# Patient Record
Sex: Female | Born: 1945 | Race: White | Hispanic: No | Smoking: Current every day smoker
Health system: Southern US, Community
[De-identification: ages and names within clinical notes are randomized; demographics above are authoritative.]

## PROBLEM LIST (undated history)

## (undated) DIAGNOSIS — M1990 Unspecified osteoarthritis, unspecified site: Secondary | ICD-10-CM

## (undated) DIAGNOSIS — E7800 Pure hypercholesterolemia, unspecified: Secondary | ICD-10-CM

## (undated) DIAGNOSIS — F419 Anxiety disorder, unspecified: Secondary | ICD-10-CM

## (undated) DIAGNOSIS — F17200 Nicotine dependence, unspecified, uncomplicated: Secondary | ICD-10-CM

## (undated) DIAGNOSIS — M810 Age-related osteoporosis without current pathological fracture: Secondary | ICD-10-CM

## (undated) DIAGNOSIS — G4700 Insomnia, unspecified: Secondary | ICD-10-CM

## (undated) DIAGNOSIS — J45909 Unspecified asthma, uncomplicated: Secondary | ICD-10-CM

## (undated) DIAGNOSIS — N952 Postmenopausal atrophic vaginitis: Secondary | ICD-10-CM

## (undated) DIAGNOSIS — K219 Gastro-esophageal reflux disease without esophagitis: Secondary | ICD-10-CM

## (undated) DIAGNOSIS — J449 Chronic obstructive pulmonary disease, unspecified: Secondary | ICD-10-CM

## (undated) HISTORY — PX: COLONOSCOPY: SHX174

## (undated) HISTORY — DX: Nicotine dependence, unspecified, uncomplicated: F17.200

## (undated) HISTORY — PX: BREAST BIOPSY: SHX20

## (undated) HISTORY — DX: Anxiety disorder, unspecified: F41.9

## (undated) HISTORY — DX: Chronic obstructive pulmonary disease, unspecified: J44.9

## (undated) HISTORY — PX: OTHER SURGICAL HISTORY: SHX169

## (undated) HISTORY — DX: Insomnia, unspecified: G47.00

## (undated) HISTORY — DX: Unspecified asthma, uncomplicated: J45.909

## (undated) HISTORY — PX: ESOPHAGOGASTRODUODENOSCOPY: SHX1529

## (undated) HISTORY — DX: Gastro-esophageal reflux disease without esophagitis: K21.9

## (undated) HISTORY — DX: Postmenopausal atrophic vaginitis: N95.2

## (undated) HISTORY — DX: Age-related osteoporosis without current pathological fracture: M81.0

## (undated) HISTORY — PX: VAGINAL HYSTERECTOMY: SUR661

## (undated) HISTORY — DX: Pure hypercholesterolemia, unspecified: E78.00

## (undated) HISTORY — DX: Unspecified osteoarthritis, unspecified site: M19.90

---

## 2005-12-29 HISTORY — PX: APPENDECTOMY: SHX54

## 2019-10-26 ENCOUNTER — Ambulatory Visit (INDEPENDENT_AMBULATORY_CARE_PROVIDER_SITE_OTHER): Payer: Medicare Other | Admitting: Urology

## 2019-10-26 DIAGNOSIS — N3021 Other chronic cystitis with hematuria: Secondary | ICD-10-CM | POA: Diagnosis not present

## 2019-12-20 ENCOUNTER — Encounter: Payer: Self-pay | Admitting: Urology

## 2020-03-05 ENCOUNTER — Telehealth: Payer: Self-pay | Admitting: Urology

## 2020-03-05 NOTE — Telephone Encounter (Signed)
I left her a message for her. She can do a nurse visit this Wednesday for a urine drop off. Not sure which number she will call back

## 2020-03-05 NOTE — Telephone Encounter (Signed)
Patient called ans states she is having issue with UTI. She has appt on 03/28/20 with Dr Ronne Binning but feels she may need something before whether it be virtual visit or what have you.

## 2020-03-06 NOTE — Telephone Encounter (Signed)
Patient called and she is going to her PCPs office to do urine culture.

## 2020-03-07 ENCOUNTER — Other Ambulatory Visit: Payer: Self-pay

## 2020-03-09 ENCOUNTER — Other Ambulatory Visit: Payer: Self-pay

## 2020-03-13 ENCOUNTER — Other Ambulatory Visit: Payer: Self-pay

## 2020-03-13 ENCOUNTER — Telehealth: Payer: Self-pay

## 2020-03-13 DIAGNOSIS — R300 Dysuria: Secondary | ICD-10-CM

## 2020-03-13 NOTE — Telephone Encounter (Signed)
-----   Message from Malen Gauze, MD sent at 03/13/2020  7:51 AM EDT ----- Please send bactrim DS BID for 7 days ----- Message ----- From: Ferdinand Lango, RN Sent: 03/09/2020   8:49 AM EDT To: Malen Gauze, MD  Please review.  ----- Message ----- From: Peter Congo Sent: 03/09/2020   8:38 AM EST To: [facility] [place] [facility]  Please review.

## 2020-03-13 NOTE — Telephone Encounter (Signed)
Called pt. Pt was actually seen by PCP last week and placed on cipro for urine results. Pt is on last day of cipro and appt scheduled for tomorrow to see MD per pt request.

## 2020-03-13 NOTE — Telephone Encounter (Signed)
Called pt. No answer. No way to leave message. No pharmacy listed in Epic to send in Rx

## 2020-03-13 NOTE — Telephone Encounter (Signed)
-----   Message from Patrick L McKenzie, MD sent at 03/13/2020  7:51 AM EDT ----- Please send bactrim DS BID for 7 days ----- Message ----- From: Ramari Bray K, RN Sent: 03/09/2020   8:49 AM EDT To: Patrick L McKenzie, MD  Please review.  ----- Message ----- From: Pruitt, Tracy M Sent: 03/09/2020   8:38 AM EST To: [facility]  Please review.   

## 2020-03-14 ENCOUNTER — Other Ambulatory Visit: Payer: Self-pay

## 2020-03-14 ENCOUNTER — Encounter: Payer: Self-pay | Admitting: Urology

## 2020-03-14 ENCOUNTER — Ambulatory Visit (INDEPENDENT_AMBULATORY_CARE_PROVIDER_SITE_OTHER): Payer: Medicare Other | Admitting: Urology

## 2020-03-14 VITALS — BP 119/59 | HR 80 | Temp 97.8°F | Ht 63.5 in | Wt 135.0 lb

## 2020-03-14 DIAGNOSIS — R300 Dysuria: Secondary | ICD-10-CM | POA: Diagnosis not present

## 2020-03-14 DIAGNOSIS — N3021 Other chronic cystitis with hematuria: Secondary | ICD-10-CM | POA: Insufficient documentation

## 2020-03-14 LAB — POCT URINALYSIS DIPSTICK
Bilirubin, UA: NEGATIVE
Glucose, UA: NEGATIVE
Ketones, UA: NEGATIVE
Nitrite, UA: NEGATIVE
Protein, UA: NEGATIVE
Spec Grav, UA: 1.015 (ref 1.010–1.025)
Urobilinogen, UA: NEGATIVE E.U./dL — AB
pH, UA: 6.5 (ref 5.0–8.0)

## 2020-03-14 MED ORDER — NITROFURANTOIN MACROCRYSTAL 100 MG PO CAPS: 100.0000 mg | ORAL_CAPSULE | Freq: Two times a day (BID) | ORAL | 5 refills | Status: DC

## 2020-03-14 NOTE — Progress Notes (Signed)
pocUrological Symptom Review  Patient is experiencing the following symptoms: None  Review of Systems  Gastrointestinal (upper)  : Negative for upper GI symptoms  Gastrointestinal (lower) : Negative for lower GI symptoms  Constitutional : Negative for symptoms  Skin: Negative for skin symptoms  Eyes: Negative for eye symptoms  Ear/Nose/Throat : Negative for Ear/Nose/Throat symptoms  Hematologic/Lymphatic: Negative for Hematologic/Lymphatic symptoms  Cardiovascular : Negative for cardiovascular symptoms  Respiratory : Negative for respiratory symptoms  Endocrine: Negative for endocrine symptoms  Musculoskeletal: Negative for musculoskeletal symptoms  Neurological: Negative for neurological symptoms  Psychologic: Negative for psychiatric symptoms

## 2020-03-14 NOTE — Patient Instructions (Signed)
Urinary Tract Infection, Adult A urinary tract infection (UTI) is an infection of any part of the urinary tract. The urinary tract includes:  The kidneys.  The ureters.  The bladder.  The urethra. These organs make, store, and get rid of pee (urine) in the body. What are the causes? This is caused by germs (bacteria) in your genital area. These germs grow and cause swelling (inflammation) of your urinary tract. What increases the risk? You are more likely to develop this condition if:  You have a small, thin tube (catheter) to drain pee.  You cannot control when you pee or poop (incontinence).  You are female, and: ? You use these methods to prevent pregnancy:  A medicine that kills sperm (spermicide).  A device that blocks sperm (diaphragm). ? You have low levels of a female hormone (estrogen). ? You are pregnant.  You have genes that add to your risk.  You are sexually active.  You take antibiotic medicines.  You have trouble peeing because of: ? A prostate that is bigger than normal, if you are female. ? A blockage in the part of your body that drains pee from the bladder (urethra). ? A kidney stone. ? A nerve condition that affects your bladder (neurogenic bladder). ? Not getting enough to drink. ? Not peeing often enough.  You have other conditions, such as: ? Diabetes. ? A weak disease-fighting system (immune system). ? Sickle cell disease. ? Gout. ? Injury of the spine. What are the signs or symptoms? Symptoms of this condition include:  Needing to pee right away (urgently).  Peeing often.  Peeing small amounts often.  Pain or burning when peeing.  Blood in the pee.  Pee that smells bad or not like normal.  Trouble peeing.  Pee that is cloudy.  Fluid coming from the vagina, if you are female.  Pain in the belly or lower back. Other symptoms include:  Throwing up (vomiting).  No urge to eat.  Feeling mixed up (confused).  Being tired  and grouchy (irritable).  A fever.  Watery poop (diarrhea). How is this treated? This condition may be treated with:  Antibiotic medicine.  Other medicines.  Drinking enough water. Follow these instructions at home:  Medicines  Take over-the-counter and prescription medicines only as told by your doctor.  If you were prescribed an antibiotic medicine, take it as told by your doctor. Do not stop taking it even if you start to feel better. General instructions  Make sure you: ? Pee until your bladder is empty. ? Do not hold pee for a long time. ? Empty your bladder after sex. ? Wipe from front to back after pooping if you are a female. Use each tissue one time when you wipe.  Drink enough fluid to keep your pee pale yellow.  Keep all follow-up visits as told by your doctor. This is important. Contact a doctor if:  You do not get better after 1-2 days.  Your symptoms go away and then come back. Get help right away if:  You have very bad back pain.  You have very bad pain in your lower belly.  You have a fever.  You are sick to your stomach (nauseous).  You are throwing up. Summary  A urinary tract infection (UTI) is an infection of any part of the urinary tract.  This condition is caused by germs in your genital area.  There are many risk factors for a UTI. These include having a small, thin   tube to drain pee and not being able to control when you pee or poop.  Treatment includes antibiotic medicines for germs.  Drink enough fluid to keep your pee pale yellow. This information is not intended to replace advice given to you by your health care provider. Make sure you discuss any questions you have with your health care provider. Document Revised: 12/02/2018 Document Reviewed: 06/24/2018 Elsevier Patient Education  2020 Elsevier Inc.  

## 2020-03-14 NOTE — Progress Notes (Signed)
   03/14/2020 2:20 PM   Stacie Ferrell Dec 18, 1946 [id number]  Referring provider: No referring provider defined for this encounter.  dysuria  HPI: Stacie Ferrell is a 74yo here for followup for recurrent UTIs. She is currently on bactrim post coital prophylaxis and  Was doing well until the last week when she developed a UTI, Proteus. No sensitivities available. She finished rx for cipro. Her dysuria resolved. No worsening LUTS. She has issues with constipation and is taking miralax 17g daily  She has stopped topical estrogen therapy   PMH: No past medical history on file.  Surgical History:   Home Medications:  Allergies as of 03/14/2020      Reactions   Daypro [oxaprozin] Rash      Medication List    as of March 14, 2020  2:20 PM   You have not been prescribed any medications.     Allergies:  Allergies  Allergen Reactions  . Daypro [Oxaprozin] Rash    Family History: No family history on file.  Social History:  reports that she has been smoking cigarettes. She does not have any smokeless tobacco history on file. No history on file for alcohol and drug.  ROS: All other review of systems were reviewed and are negative except what is noted above in HPI  Physical Exam: BP (!) 119/59   Pulse 80   Temp 97.8 F (36.6 C)   Ht 5' 3.5" (1.613 m)   Wt 135 lb (61.2 kg)   BMI 23.54 kg/m   Constitutional:  Alert and oriented, No acute distress. HEENT: [place] AT, moist mucus membranes.  Trachea midline, no masses. Cardiovascular: No clubbing, cyanosis, or edema. Respiratory: Normal respiratory effort, no increased work of breathing. GI: Abdomen is soft, nontender, nondistended, no abdominal masses GU: No CVA tenderness Lymph: No cervical or inguinal lymphadenopathy. Skin: No rashes, bruises or suspicious lesions. Neurologic: Grossly intact, no focal deficits, moving all 4 extremities. Psychiatric: Normal mood and affect.  Laboratory Data: No results found for: WBC, HGB, HCT,  MCV, PLT  No results found for: CREATININE  No results found for: PSA  No results found for: TESTOSTERONE  No results found for: HGBA1C  Urinalysis    Component Value Date/Time   BILIRUBINUR neg 03/14/2020 1417   PROTEINUR Negative 03/14/2020 1417   UROBILINOGEN negative (A) 03/14/2020 1417   NITRITE neg 03/14/2020 1417   LEUKOCYTESUR Trace (A) 03/14/2020 1417    No results found for: LABMICR, WBCUA, RBCUA, LABEPIT, MUCUS, BACTERIA  Pertinent Imaging:  No results found for this or any previous visit. No results found for this or any previous visit. No results found for this or any previous visit. No results found for this or any previous visit. No results found for this or any previous visit. No results found for this or any previous visit. No results found for this or any previous visit. No results found for this or any previous visit.  Assessment & Plan:    1. Dysuria -likely related to UTI. resolved - POCT urinalysis dipstick  2. Chronic cystitis with hematuria -We discussed the natural hx of recurrent UTIs and the various causes. We discussed the treatment options including post coital prophylaxis, daily prophylaxis, topical estrogen therapy. We have agreed to pursue self start macrodantin. RTC 6 months    No follow-ups on file.  Wilkie Aye, MD  [facility] [place]

## 2020-03-28 ENCOUNTER — Ambulatory Visit: Payer: Medicare Other | Admitting: Urology

## 2020-04-02 ENCOUNTER — Telehealth: Payer: Self-pay | Admitting: Urology

## 2020-04-02 ENCOUNTER — Other Ambulatory Visit (HOSPITAL_COMMUNITY)
Admission: AD | Admit: 2020-04-02 | Discharge: 2020-04-02 | Disposition: A | Discharge status: Home / Self Care | Payer: Medicare Other | Source: Other Acute Inpatient Hospital | Attending: Urology | Admitting: Urology

## 2020-04-02 ENCOUNTER — Ambulatory Visit (INDEPENDENT_AMBULATORY_CARE_PROVIDER_SITE_OTHER): Payer: Medicare Other | Admitting: Urology

## 2020-04-02 ENCOUNTER — Other Ambulatory Visit: Payer: Self-pay

## 2020-04-02 DIAGNOSIS — N3021 Other chronic cystitis with hematuria: Secondary | ICD-10-CM

## 2020-04-02 DIAGNOSIS — R300 Dysuria: Secondary | ICD-10-CM

## 2020-04-02 MED ORDER — NITROFURANTOIN MACROCRYSTAL 100 MG PO CAPS: 100.0000 mg | ORAL_CAPSULE | Freq: Two times a day (BID) | ORAL | 6 refills | Status: DC

## 2020-04-02 NOTE — Telephone Encounter (Signed)
Patient coming in to bring urine specimen today per nurse.

## 2020-04-02 NOTE — Telephone Encounter (Signed)
Pt states UTI meds not working, she requests a nurse return her call.

## 2020-04-02 NOTE — Progress Notes (Signed)
Pt came and left a sample at office with UTI symptoms. Urine sent for culture.

## 2020-04-03 ENCOUNTER — Telehealth: Payer: Self-pay | Admitting: Urology

## 2020-04-03 NOTE — Telephone Encounter (Signed)
Pt  Called and states that the same medication was sent in that she told us does not work for her infections.

## 2020-04-03 NOTE — Telephone Encounter (Signed)
Pt called back again.  Requests a call back

## 2020-04-03 NOTE — Telephone Encounter (Signed)
Pt. Called back and asked if urine would be cultured and then see about what med to send. I explained that was the plan. Pt. Asked to be called back on cell phone number.

## 2020-04-03 NOTE — Telephone Encounter (Signed)
Attempted to call pt. Back again. Still no answer.

## 2020-04-03 NOTE — Telephone Encounter (Signed)
Attempted to call pt. No answer. No VM . 

## 2020-04-04 ENCOUNTER — Ambulatory Visit (INDEPENDENT_AMBULATORY_CARE_PROVIDER_SITE_OTHER): Payer: Medicare Other | Admitting: Internal Medicine

## 2020-04-04 ENCOUNTER — Encounter: Payer: Self-pay | Admitting: Internal Medicine

## 2020-04-04 VITALS — BP 120/70 | HR 95 | Temp 97.9°F | Ht 63.5 in | Wt 137.0 lb

## 2020-04-04 DIAGNOSIS — K5900 Constipation, unspecified: Secondary | ICD-10-CM | POA: Diagnosis not present

## 2020-04-04 DIAGNOSIS — K5730 Diverticulosis of large intestine without perforation or abscess without bleeding: Secondary | ICD-10-CM

## 2020-04-04 DIAGNOSIS — Z86010 Personal history of colonic polyps: Secondary | ICD-10-CM | POA: Diagnosis not present

## 2020-04-04 LAB — URINE CULTURE: Culture: 30000 — AB

## 2020-04-04 NOTE — Progress Notes (Signed)
Stacie Ferrell 74 y.o. [birth year] [id number]  Assessment & Plan:   Encounter Diagnoses  Name Primary?   Constipation, transient Yes   Hx of adenomatous colonic polyps    Diverticulosis of colon without hemorrhage     I think it is fine for her to stop MiraLAX and see how she does.  Prior to her transient several day history of constipation she was doing well and not needing laxatives.  She may need to titrate MiraLAX back but since right now sometimes she has difficulty determining gas versus stool and has had some incontinence it makes perfect sense to stop and see.  She prefers not to use fiber.  Says she eats a high-fiber diet.  I do not know that she needs a routine repeat colonoscopy based upon age and previous findings.  We reviewed diverticulosis and the narrowing of the colon that can be associated with that and the fact that her prior hysterectomy probably has led to a fixed sigmoid colon.  I will be available to see her as needed.  CC: Stacie Rabon, MD   Subjective:   Chief Complaint: Constipation  HPI Stacie Ferrell is a 74 year old woman known to one of our nurses in the endoscopy area who is here because of recent problems with constipation.  She has been followed in [facility] by Stacie Fer[facility], last colonoscopy 2020 with 2 small subcentimeter adenomas in the cecum and adherent tortuous rectosigmoid colon with some diverticulosis.  She had about a 4-day period of fairly severe constipation about 8 weeks ago she tried over-the-counter medications and eventually got some relief she saw Stacie Ferrell he recommended regular MiraLAX to twice daily which she has titrated down to 1 a day.  She still has issues where she cannot always tell if it is going to be flatus or stool and has some fecal leakage and wonders if she can stop the MiraLAX.  Bowel movements are sometimes a little small since starting the MiraLAX.  Prior to that absolutely no trouble with constipation.  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  She feels well now other than those issues mentioned above. Allergies  Allergen Reactions   Codeine Sulfate [Codeine] Nausea Only    On an empty stomach   Fosamax [Alendronate] Other (See Comments)    GERD worse and bone ache   Daypro [Oxaprozin] Rash   Voltaren [Diclofenac] Rash   Current Meds  Medication Sig   calcium carbonate (OSCAL) 1500 (600 Ca) MG TABS tablet Take by mouth 2 (two) times daily with a meal.   cholecalciferol (VITAMIN D3) 25 MCG (1000 UNIT) tablet Take 1,000 Units by mouth daily.   denosumab (PROLIA) 60 MG/ML SOSY injection Inject 60 mg into the skin every 6 (six) months.   diphenhydrAMINE (BENADRYL) 25 MG tablet Take 25 mg by mouth every 6 (six) hours as needed.   estradiol (ESTRACE) 0.1 MG/GM vaginal cream Place 1 Applicatorful vaginally 2 (two) times a week.   Multiple Vitamins-Minerals (ICAPS AREDS 2 PO) Take by mouth.   Multiple Vitamins-Minerals (SENIOR MULTIVITAMIN PLUS PO) Take by mouth.   nitrofurantoin (MACRODANTIN) 100 MG capsule Take 1 capsule (100 mg total) by mouth 2 (two) times daily.   traMADol (ULTRAM) 50 MG tablet Take  by mouth every 6 (six) hours as needed.   [DISCONTINUED] polyethylene glycol (MIRALAX / GLYCOLAX) 17 g packet Take 17 g by mouth daily.   Past Medical History:  Diagnosis Date   Anxiety    Asthma    Chronic obstructive pulmonary disease (COPD) (HCC)    GERD (gastroesophageal reflux disease)    Hypercholesterolemia    Insomnia    Osteoarthrosis    Osteoporosis    Postmenopausal atrophic vaginitis    Tobacco use disorder    Past Surgical History:  Procedure Laterality Date   APPENDECTOMY  2007   BREAST BIOPSY     cecum volvulus abdominal  surgery     COLONOSCOPY     ESOPHAGOGASTRODUODENOSCOPY     ingrown toenails     VAGINAL HYSTERECTOMY     Social History   Social History Narrative   Widowed and retired 2 children   She is a smoker but no alcohol or drug use   family history includes COPD in her mother; Cancer in her mother; Colon cancer in her father; Congestive Heart Failure in her mother; Parkinson's disease in her father.   Review of Systems Chronic recurrent UTI issues Rare stress urinary incontinence  Objective:   Physical Exam BP 120/70    Pulse 95    Temp 97.9 F (36.6 C)    Ht 5' 3.5" (1.613 m)    Wt 137 lb (62.1 kg)    BMI 23.89 kg/m  No acute distress Well-developed well-nourished white woman, elderly but appears age-appropriate or younger Abdomen is soft there are surgical scars I cannot detect any hernia bowel sounds are present nontender no masses   Data reviewed include 2020 colonoscopy 2020 EGD 2015 colonoscopy pathology report GI office notes from similar age/date range  Had a single adenoma in 2015 on colonoscopy and an inflammatory polyp, gastritis on EGD at that time  Labs at primary care August 2020 normal CBC normal CMET

## 2020-04-04 NOTE — Patient Instructions (Signed)
As we discussed, stop the MiraLax and see how you do.  You can always add it back in to your regimen if needed.  Let me know if you have questions down the road.  I appreciate the opportunity to care for you. Iva Boop, MD, [facility]

## 2020-04-04 NOTE — Telephone Encounter (Signed)
Pt called and said she can reached on her cell phone.

## 2020-04-05 ENCOUNTER — Telehealth: Payer: Self-pay | Admitting: Urology

## 2020-04-05 NOTE — Telephone Encounter (Signed)
Pt notified of antibiotic sent to pharmacy

## 2020-04-05 NOTE — Telephone Encounter (Signed)
Patient called back this morning and is upset that she didn't get a return call regarding her urine culture results and also she states she has UTI symptoms and she needs meds asap. I verbally spoke with Marchelle Folks and she is waiting on response from Dr. Ronne Binning. I communicated that to the patient and she understood.

## 2020-04-05 NOTE — Telephone Encounter (Signed)
Pt states the macrodantin post intercourse does not work for pt. She is asking for something else.

## 2020-04-05 NOTE — Telephone Encounter (Signed)
Verbal order received for ceftin 500mg  po BID for 7 days ,called prescription into [facility] in [place] and gave order to [facility] pharmacist.

## 2020-04-17 ENCOUNTER — Telehealth: Payer: Self-pay

## 2020-04-17 NOTE — Telephone Encounter (Signed)
Pt called asking for a post coital rx. She is leaving for the beach next week and is concerned she will develop an UTI.  She states macrodantin does not help. She is asking for a cipro prescription to have if symptoms warrant.

## 2020-04-20 ENCOUNTER — Telehealth: Payer: Self-pay | Admitting: Urology

## 2020-04-20 DIAGNOSIS — R300 Dysuria: Secondary | ICD-10-CM

## 2020-04-20 MED ORDER — SULFAMETHOXAZOLE-TRIMETHOPRIM 800-160 MG PO TABS: 1.0000 | ORAL_TABLET | ORAL | 0 refills | Status: DC | PRN

## 2020-04-20 NOTE — Telephone Encounter (Signed)
The patient called and she stated she has not received a phone call back regarding her RX request. She states she is going out of town on Monday and would need to have it before then.

## 2020-04-20 NOTE — Telephone Encounter (Signed)
I spoke with Dr. Ronne Binning who gave verbal for Bactrim DS 1 tablet as needed post intercourse. rx sent in. Pt notified.

## 2020-05-31 ENCOUNTER — Other Ambulatory Visit: Payer: Self-pay

## 2020-05-31 ENCOUNTER — Telehealth: Payer: Self-pay | Admitting: Urology

## 2020-05-31 DIAGNOSIS — N3021 Other chronic cystitis with hematuria: Secondary | ICD-10-CM

## 2020-05-31 MED ORDER — CIPROFLOXACIN HCL 250 MG PO TABS: 250.0000 mg | ORAL_TABLET | Freq: Once | ORAL | 1 refills | Status: AC

## 2020-05-31 NOTE — Telephone Encounter (Signed)
Prescription sent in to [facility]. Pt called.  Left message for pt to pick up rx

## 2020-05-31 NOTE — Telephone Encounter (Signed)
Pt requests nurse return her call.

## 2020-05-31 NOTE — Telephone Encounter (Signed)
Pt reports Bactrim no longer works for her post intercourse. Pt states Cipro is what urgent care gave her over this past weekend and helped her infections. Pt is asking if you could change her post intercourse abx to cipro as needed.

## 2020-05-31 NOTE — Telephone Encounter (Signed)
Yes change her to cipro 250mg 

## 2020-07-12 ENCOUNTER — Other Ambulatory Visit: Payer: Self-pay

## 2020-07-12 ENCOUNTER — Encounter: Payer: Self-pay | Admitting: Urology

## 2020-07-12 ENCOUNTER — Ambulatory Visit (INDEPENDENT_AMBULATORY_CARE_PROVIDER_SITE_OTHER): Payer: Medicare Other | Admitting: Urology

## 2020-07-12 VITALS — BP 114/68 | HR 68 | Temp 97.8°F | Ht 63.5 in | Wt 135.4 lb

## 2020-07-12 DIAGNOSIS — R300 Dysuria: Secondary | ICD-10-CM

## 2020-07-12 DIAGNOSIS — N3021 Other chronic cystitis with hematuria: Secondary | ICD-10-CM | POA: Diagnosis not present

## 2020-07-12 LAB — POCT URINALYSIS DIPSTICK
Bilirubin, UA: NEGATIVE
Blood, UA: NEGATIVE
Glucose, UA: NEGATIVE
Ketones, UA: NEGATIVE
Leukocytes, UA: NEGATIVE
Nitrite, UA: NEGATIVE
Protein, UA: NEGATIVE
Spec Grav, UA: <=1.005 — AB (ref 1.010–1.025)
Urobilinogen, UA: 0.2 E.U./dL
pH, UA: 7 (ref 5.0–8.0)

## 2020-07-12 MED ORDER — CIPROFLOXACIN HCL 250 MG PO TABS
250.0000 mg | ORAL_TABLET | Freq: Two times a day (BID) | ORAL | 5 refills | Status: DC
Start: 2020-07-12 — End: 2021-03-01

## 2020-07-12 NOTE — Patient Instructions (Signed)
Urinary Tract Infection, Adult A urinary tract infection (UTI) is an infection of any part of the urinary tract. The urinary tract includes:  The kidneys.  The ureters.  The bladder.  The urethra. These organs make, store, and get rid of pee (urine) in the body. What are the causes? This is caused by germs (bacteria) in your genital area. These germs grow and cause swelling (inflammation) of your urinary tract. What increases the risk? You are more likely to develop this condition if:  You have a small, thin tube (catheter) to drain pee.  You cannot control when you pee or poop (incontinence).  You are female, and: ? You use these methods to prevent pregnancy:  A medicine that kills sperm (spermicide).  A device that blocks sperm (diaphragm). ? You have low levels of a female hormone (estrogen). ? You are pregnant.  You have genes that add to your risk.  You are sexually active.  You take antibiotic medicines.  You have trouble peeing because of: ? A prostate that is bigger than normal, if you are female. ? A blockage in the part of your body that drains pee from the bladder (urethra). ? A kidney stone. ? A nerve condition that affects your bladder (neurogenic bladder). ? Not getting enough to drink. ? Not peeing often enough.  You have other conditions, such as: ? Diabetes. ? A weak disease-fighting system (immune system). ? Sickle cell disease. ? Gout. ? Injury of the spine. What are the signs or symptoms? Symptoms of this condition include:  Needing to pee right away (urgently).  Peeing often.  Peeing small amounts often.  Pain or burning when peeing.  Blood in the pee.  Pee that smells bad or not like normal.  Trouble peeing.  Pee that is cloudy.  Fluid coming from the vagina, if you are female.  Pain in the belly or lower back. Other symptoms include:  Throwing up (vomiting).  No urge to eat.  Feeling mixed up (confused).  Being tired  and grouchy (irritable).  A fever.  Watery poop (diarrhea). How is this treated? This condition may be treated with:  Antibiotic medicine.  Other medicines.  Drinking enough water. Follow these instructions at home:  Medicines  Take over-the-counter and prescription medicines only as told by your doctor.  If you were prescribed an antibiotic medicine, take it as told by your doctor. Do not stop taking it even if you start to feel better. General instructions  Make sure you: ? Pee until your bladder is empty. ? Do not hold pee for a long time. ? Empty your bladder after sex. ? Wipe from front to back after pooping if you are a female. Use each tissue one time when you wipe.  Drink enough fluid to keep your pee pale yellow.  Keep all follow-up visits as told by your doctor. This is important. Contact a doctor if:  You do not get better after 1-2 days.  Your symptoms go away and then come back. Get help right away if:  You have very bad back pain.  You have very bad pain in your lower belly.  You have a fever.  You are sick to your stomach (nauseous).  You are throwing up. Summary  A urinary tract infection (UTI) is an infection of any part of the urinary tract.  This condition is caused by germs in your genital area.  There are many risk factors for a UTI. These include having a small, thin   tube to drain pee and not being able to control when you pee or poop.  Treatment includes antibiotic medicines for germs.  Drink enough fluid to keep your pee pale yellow. This information is not intended to replace advice given to you by your health care provider. Make sure you discuss any questions you have with your health care provider. Document Revised: 12/02/2018 Document Reviewed: 06/24/2018 Elsevier Patient Education  2020 Elsevier Inc.  

## 2020-07-12 NOTE — Progress Notes (Signed)
Urological Symptom Review  Patient is experiencing the following symptoms: none   Review of Systems  Gastrointestinal (upper)  : Negative for upper GI symptoms  Gastrointestinal (lower) : Constipation  Constitutional : Negative for symptoms  Skin: Negative for skin symptoms  Eyes: Negative for eye symptoms  Ear/Nose/Throat : Negative for Ear/Nose/Throat symptoms  Hematologic/Lymphatic: Negative for Hematologic/Lymphatic symptoms  Cardiovascular : Negative for cardiovascular symptoms  Respiratory : Negative for respiratory symptoms  Endocrine: Negative for endocrine symptoms  Musculoskeletal: Negative for musculoskeletal symptoms  Neurological: Negative for neurological symptoms  Psychologic: Negative for psychiatric symptoms  

## 2020-07-12 NOTE — Progress Notes (Signed)
07/12/2020 11:09 AM   Stacie Ferrell [birth year] [id number]  Referring provider: Romeo Rabon, MD [address],  [place] [address]  Recurrent UTIs  HPI: Stacie Ferrell is a 74yo here for followup for recurrent UTIs. She has been doing prophylactic cipro 250mg  after intercourse. No UTIs since last visit. Last urine culture was proteus resistent to macrobid and bactrim. She denies nay significant LUTS. No recent dysuria. No hematuria   PMH: Past Medical History:  Diagnosis Date  . Anxiety   . Asthma   . Chronic obstructive pulmonary disease (COPD) (HCC)   . GERD (gastroesophageal reflux disease)   . Hypercholesterolemia   . Insomnia   . Osteoarthrosis   . Osteoporosis   . Postmenopausal atrophic vaginitis   . Tobacco use disorder     Surgical History: Past Surgical History:  Procedure Laterality Date  . APPENDECTOMY  2007  . BREAST BIOPSY    . cecum volvulus abdominal surgery    . COLONOSCOPY    . ESOPHAGOGASTRODUODENOSCOPY    . ingrown toenails    . VAGINAL HYSTERECTOMY      Home Medications:  Allergies as of 07/12/2020      Reactions   Codeine Sulfate [codeine] Nausea Only   On an empty stomach   Fosamax [alendronate] Other (See Comments)   GERD worse and bone ache   Daypro [oxaprozin] Rash   Voltaren [diclofenac] Rash      Medication List       Accurate as of July 12, 2020 11:09 AM. If you have any questions, ask your nurse or doctor.        calcium carbonate 1500 (600 Ca) MG Tabs tablet Commonly known as: OSCAL Take by mouth 2 (two) times daily with a meal.   cholecalciferol 25 MCG (1000 UNIT) tablet Commonly known as: VITAMIN D3 Take 1,000 Units by mouth daily.   Colace 100 MG capsule Generic drug: docusate sodium Take 200 mg by mouth at bedtime.   diphenhydrAMINE 25 MG tablet Commonly known as: BENADRYL Take 25 mg by mouth every 6 (six) hours as needed.   estradiol 0.1 MG/GM vaginal cream Commonly known as: ESTRACE Place 1  Applicatorful vaginally 2 (two) times a week.   omeprazole 40 MG capsule Commonly known as: PRILOSEC Take 40 mg by mouth daily.   Prolia 60 MG/ML Sosy injection Generic drug: denosumab Inject 60 mg into the skin every 6 (six) months.   SENIOR MULTIVITAMIN PLUS PO Take by mouth.   ICAPS AREDS 2 PO Take by mouth.   traMADol 50 MG tablet Commonly known as: ULTRAM Take by mouth every 6 (six) hours as needed.       Allergies:  Allergies  Allergen Reactions  . Codeine Sulfate [Codeine] Nausea Only    On an empty stomach  . Fosamax [Alendronate] Other (See Comments)    GERD worse and bone ache  . Daypro [Oxaprozin] Rash  . Voltaren [Diclofenac] Rash    Family History: Family History  Problem Relation Age of Onset  . Congestive Heart Failure Mother   . COPD Mother   . Cancer Mother   . Colon cancer Father   . Parkinson's disease Father     Social History:  reports that she has been smoking cigarettes. She has been smoking about 0.50 packs per day. She has never used smokeless tobacco. She reports that she does not drink alcohol and does not use drugs.  ROS: All other review of systems were reviewed and are negative except  what is noted above in HPI  Physical Exam: BP 114/68   Pulse 68   Temp 97.8 F (36.6 C)   Ht 5' 3.5" (1.613 m)   Wt 135 lb 6.4 oz (61.4 kg)   BMI 23.61 kg/m   Constitutional:  Alert and oriented, No acute distress. HEENT: [place] AT, moist mucus membranes.  Trachea midline, no masses. Cardiovascular: No clubbing, cyanosis, or edema. Respiratory: Normal respiratory effort, no increased work of breathing. GI: Abdomen is soft, nontender, nondistended, no abdominal masses GU: No CVA tenderness.  Lymph: No cervical or inguinal lymphadenopathy. Skin: No rashes, bruises or suspicious lesions. Neurologic: Grossly intact, no focal deficits, moving all 4 extremities. Psychiatric: Normal mood and affect.  Laboratory Data: No results found for: WBC, HGB,  HCT, MCV, PLT  No results found for: CREATININE  No results found for: PSA  No results found for: TESTOSTERONE  No results found for: HGBA1C  Urinalysis    Component Value Date/Time   BILIRUBINUR neg 03/14/2020 1417   PROTEINUR Negative 03/14/2020 1417   UROBILINOGEN negative (A) 03/14/2020 1417   NITRITE neg 03/14/2020 1417   LEUKOCYTESUR Trace (A) 03/14/2020 1417    No results found for: LABMICR, WBCUA, RBCUA, LABEPIT, MUCUS, BACTERIA  Pertinent Imaging:  No results found for this or any previous visit.  No results found for this or any previous visit.  No results found for this or any previous visit.  No results found for this or any previous visit.  No results found for this or any previous visit.  No results found for this or any previous visit.  No results found for this or any previous visit.  No results found for this or any previous visit.   Assessment & Plan:    1. Dysuria -resolved - POCT urinalysis dipstick  2. Chronic cystitis with hematuria -post coital cipro 250mg  -RTC 6 months  No follow-ups on file.  , MD  [facility]

## 2020-09-12 ENCOUNTER — Ambulatory Visit: Payer: Medicare Other | Admitting: Urology

## 2020-09-19 ENCOUNTER — Ambulatory Visit: Payer: Medicare Other | Admitting: Urology

## 2021-01-17 ENCOUNTER — Ambulatory Visit: Payer: Medicare Other | Admitting: Urology

## 2021-03-01 ENCOUNTER — Encounter: Payer: Self-pay | Admitting: Urology

## 2021-03-01 ENCOUNTER — Ambulatory Visit (INDEPENDENT_AMBULATORY_CARE_PROVIDER_SITE_OTHER): Payer: Medicare Other | Admitting: Urology

## 2021-03-01 ENCOUNTER — Other Ambulatory Visit: Payer: Self-pay

## 2021-03-01 VITALS — BP 102/59 | HR 102 | Ht 63.0 in | Wt 133.0 lb

## 2021-03-01 DIAGNOSIS — R300 Dysuria: Secondary | ICD-10-CM

## 2021-03-01 LAB — URINALYSIS, ROUTINE W REFLEX MICROSCOPIC
Bilirubin, UA: NEGATIVE
Glucose, UA: NEGATIVE
Ketones, UA: NEGATIVE
Leukocytes,UA: NEGATIVE
Nitrite, UA: NEGATIVE
Protein,UA: NEGATIVE
RBC, UA: NEGATIVE
Specific Gravity, UA: <1.005 — ABNORMAL LOW (ref 1.005–1.030)
Urobilinogen, Ur: 0.2 mg/dL (ref 0.2–1.0)
pH, UA: 6 (ref 5.0–7.5)

## 2021-03-01 MED ORDER — CIPROFLOXACIN HCL 250 MG PO TABS: 250.0000 mg | ORAL_TABLET | Freq: Two times a day (BID) | ORAL | 5 refills | Status: DC

## 2021-03-01 NOTE — Progress Notes (Signed)
03/01/2021 11:22 AM   Stacie Ferrell 1946-08-14 [id number]  Referring provider: Romeo Rabon, MD [address]  [place] [address]  Chief Complaint  Patient presents with  . Follow-up    72month    HPI: [name] is a 75yo here for followup for recurrent UTI. She has taken post coital cipro prophylaxis which has prevented her UTIs. No UTI since last visit. UA today is normal. She denies any LUTS.    PMH: Past Medical History:  Diagnosis Date  . Anxiety   . Asthma   . Chronic obstructive pulmonary disease (COPD) (HCC)   . GERD (gastroesophageal reflux disease)   . Hypercholesterolemia   . Insomnia   . Osteoarthrosis   . Osteoporosis   . Postmenopausal atrophic vaginitis   . Tobacco use disorder     Surgical History: Past Surgical History:  Procedure Laterality Date  . APPENDECTOMY  2007  . BREAST BIOPSY    . cecum volvulus abdominal surgery    . COLONOSCOPY    . ESOPHAGOGASTRODUODENOSCOPY    . ingrown toenails    . VAGINAL HYSTERECTOMY      Home Medications:  Allergies as of 03/01/2021      Reactions   Codeine Sulfate [codeine] Nausea Only   On an empty stomach   Fosamax [alendronate] Other (See Comments)   GERD worse and bone ache   Daypro [oxaprozin] Rash   Voltaren [diclofenac] Rash      Medication List       Accurate as of March 01, 2021 11:22 AM. If you have any questions, ask your nurse or doctor.        calcium carbonate 1500 (600 Ca) MG Tabs tablet Commonly known as: OSCAL Take by mouth 2 (two) times daily with a meal.   cholecalciferol 25 MCG (1000 UNIT) tablet Commonly known as: VITAMIN D3 Take 1,000 Units by mouth daily.   ciprofloxacin 250 MG tablet Commonly known as: Cipro Take 1 tablet (250 mg total) by mouth 2 (two) times daily.   Colace 100 MG capsule Generic drug: docusate sodium Take 200 mg by mouth at bedtime.   diphenhydrAMINE 25 MG tablet Commonly known as: BENADRYL Take 25 mg by mouth every 6 (six) hours as  needed.   estradiol 0.1 MG/GM vaginal cream Commonly known as: ESTRACE Place 1 Applicatorful vaginally 2 (two) times a week.   omeprazole 40 MG capsule Commonly known as: PRILOSEC Take 40 mg by mouth daily.   Prolia 60 MG/ML Sosy injection Generic drug: denosumab Inject 60 mg into the skin every 6 (six) months.   rosuvastatin 10 MG tablet Commonly known as: CRESTOR Take 10 mg by mouth at bedtime.   SENIOR MULTIVITAMIN PLUS PO Take by mouth.   ICAPS AREDS 2 PO Take by mouth.   traMADol 50 MG tablet Commonly known as: ULTRAM Take by mouth every 6 (six) hours as needed.       Allergies:  Allergies  Allergen Reactions  . Codeine Sulfate [Codeine] Nausea Only    On an empty stomach  . Fosamax [Alendronate] Other (See Comments)    GERD worse and bone ache  . Daypro [Oxaprozin] Rash  . Voltaren [Diclofenac] Rash    Family History: Family History  Problem Relation Age of Onset  . Congestive Heart Failure Mother   . COPD Mother   . Cancer Mother   . Colon cancer Father   . Parkinson's disease Father     Social History:  reports that she has  been smoking cigarettes. She has been smoking about 0.50 packs per day. She has never used smokeless tobacco. She reports that she does not drink alcohol and does not use drugs.  ROS: All other review of systems were reviewed and are negative except what is noted above in HPI  Physical Exam: BP (!) 102/59   Pulse (!) 102   Ht 5\' 3"  (1.6 m)   Wt 133 lb (60.3 kg)   BMI 23.56 kg/m   Constitutional:  Alert and oriented, No acute distress. HEENT: [place] AT, moist mucus membranes.  Trachea midline, no masses. Cardiovascular: No clubbing, cyanosis, or edema. Respiratory: Normal respiratory effort, no increased work of breathing. GI: Abdomen is soft, nontender, nondistended, no abdominal masses GU: No CVA tenderness.  Lymph: No cervical or inguinal lymphadenopathy. Skin: No rashes, bruises or suspicious lesions. Neurologic:  Grossly intact, no focal deficits, moving all 4 extremities. Psychiatric: Normal mood and affect.  Laboratory Data: No results found for: WBC, HGB, HCT, MCV, PLT  No results found for: CREATININE  No results found for: PSA  No results found for: TESTOSTERONE  No results found for: HGBA1C  Urinalysis    Component Value Date/Time   BILIRUBINUR neg 07/12/2020 1111   PROTEINUR Negative 07/12/2020 1111   UROBILINOGEN 0.2 07/12/2020 1111   NITRITE neg 07/12/2020 1111   LEUKOCYTESUR Negative 07/12/2020 1111    No results found for: LABMICR, WBCUA, RBCUA, LABEPIT, MUCUS, BACTERIA  Pertinent Imaging:  No results found for this or any previous visit.  No results found for this or any previous visit.  No results found for this or any previous visit.  No results found for this or any previous visit.  No results found for this or any previous visit.  No results found for this or any previous visit.  No results found for this or any previous visit.  No results found for this or any previous visit.   Assessment & Plan:    1. Recurrent UTI -continue post coital cipro 250mg . RTC 1 year - Urinalysis, Routine w reflex microscopic   No follow-ups on file.  07/14/2020, MD  [facility] [place]

## 2021-03-01 NOTE — Patient Instructions (Signed)
Urinary Tract Infection, Adult A urinary tract infection (UTI) is an infection of any part of the urinary tract. The urinary tract includes:  The kidneys.  The ureters.  The bladder.  The urethra. These organs make, store, and get rid of pee (urine) in the body. What are the causes? This infection is caused by germs (bacteria) in your genital area. These germs grow and cause swelling (inflammation) of your urinary tract. What increases the risk? The following factors may make you more likely to develop this condition:  Using a small, thin tube (catheter) to drain pee.  Not being able to control when you pee or poop (incontinence).  Being female. If you are female, these things can increase the risk: ? Using these methods to prevent pregnancy:  A medicine that kills sperm (spermicide).  A device that blocks sperm (diaphragm). ? Having low levels of a female hormone (estrogen). ? Being pregnant. You are more likely to develop this condition if:  You have genes that add to your risk.  You are sexually active.  You take antibiotic medicines.  You have trouble peeing because of: ? A prostate that is bigger than normal, if you are female. ? A blockage in the part of your body that drains pee from the bladder. ? A kidney stone. ? A nerve condition that affects your bladder. ? Not getting enough to drink. ? Not peeing often enough.  You have other conditions, such as: ? Diabetes. ? A weak disease-fighting system (immune system). ? Sickle cell disease. ? Gout. ? Injury of the spine. What are the signs or symptoms? Symptoms of this condition include:  Needing to pee right away.  Peeing small amounts often.  Pain or burning when peeing.  Blood in the pee.  Pee that smells bad or not like normal.  Trouble peeing.  Pee that is cloudy.  Fluid coming from the vagina, if you are female.  Pain in the belly or lower back. Other symptoms include:  Vomiting.  Not  feeling hungry.  Feeling mixed up (confused). This may be the first symptom in older adults.  Being tired and grouchy (irritable).  A fever.  Watery poop (diarrhea). How is this treated?  Taking antibiotic medicine.  Taking other medicines.  Drinking enough water. In some cases, you may need to see a specialist. Follow these instructions at home: Medicines  Take over-the-counter and prescription medicines only as told by your doctor.  If you were prescribed an antibiotic medicine, take it as told by your doctor. Do not stop taking it even if you start to feel better. General instructions  Make sure you: ? Pee until your bladder is empty. ? Do not hold pee for a long time. ? Empty your bladder after sex. ? Wipe from front to back after peeing or pooping if you are a female. Use each tissue one time when you wipe.  Drink enough fluid to keep your pee pale yellow.  Keep all follow-up visits.   Contact a doctor if:  You do not get better after 1-2 days.  Your symptoms go away and then come back. Get help right away if:  You have very bad back pain.  You have very bad pain in your lower belly.  You have a fever.  You have chills.  You feeling like you will vomit or you vomit. Summary  A urinary tract infection (UTI) is an infection of any part of the urinary tract.  This condition is caused by   germs in your genital area.  There are many risk factors for a UTI.  Treatment includes antibiotic medicines.  Drink enough fluid to keep your pee pale yellow. This information is not intended to replace advice given to you by your health care provider. Make sure you discuss any questions you have with your health care provider. Document Revised: 07/27/2020 Document Reviewed: 07/27/2020 Elsevier Patient Education  2021 Elsevier Inc.  

## 2021-03-01 NOTE — Progress Notes (Signed)
Urological Symptom Review  Patient is experiencing the following symptoms: none   Review of Systems  Gastrointestinal (upper)  : Negative for upper GI symptoms  Gastrointestinal (lower) : Constipation  Constitutional : Negative for symptoms  Skin: Negative for skin symptoms  Eyes: Negative for eye symptoms  Ear/Nose/Throat : Negative for Ear/Nose/Throat symptoms  Hematologic/Lymphatic: Negative for Hematologic/Lymphatic symptoms  Cardiovascular : Negative for cardiovascular symptoms  Respiratory : Negative for respiratory symptoms  Endocrine: Negative for endocrine symptoms  Musculoskeletal: Negative for musculoskeletal symptoms  Neurological: Negative for neurological symptoms  Psychologic: Negative for psychiatric symptoms  

## 2021-07-15 ENCOUNTER — Telehealth: Payer: Self-pay | Admitting: Internal Medicine

## 2021-07-15 NOTE — Telephone Encounter (Signed)
Patient called said she has not been able to have a solid BM for a long time now and she has been taking a lot of OTC medications. She is seeking advise on all the medications to see which one she should continue with.

## 2021-07-15 NOTE — Telephone Encounter (Signed)
Left message for patient to call back  

## 2021-07-17 ENCOUNTER — Other Ambulatory Visit: Payer: Self-pay

## 2021-07-17 ENCOUNTER — Ambulatory Visit (INDEPENDENT_AMBULATORY_CARE_PROVIDER_SITE_OTHER): Payer: Medicare Other | Admitting: Physician Assistant

## 2021-07-17 ENCOUNTER — Ambulatory Visit (INDEPENDENT_AMBULATORY_CARE_PROVIDER_SITE_OTHER)
Admission: RE | Admit: 2021-07-17 | Discharge: 2021-07-17 | Disposition: A | Discharge status: Home / Self Care | Payer: Medicare Other | Source: Ambulatory Visit | Attending: Physician Assistant | Admitting: Physician Assistant

## 2021-07-17 ENCOUNTER — Encounter: Payer: Self-pay | Admitting: Physician Assistant

## 2021-07-17 VITALS — BP 118/81 | HR 88 | Ht 63.0 in | Wt 139.6 lb

## 2021-07-17 DIAGNOSIS — Z800 Family history of malignant neoplasm of digestive organs: Secondary | ICD-10-CM | POA: Diagnosis not present

## 2021-07-17 DIAGNOSIS — K5900 Constipation, unspecified: Secondary | ICD-10-CM | POA: Diagnosis not present

## 2021-07-17 DIAGNOSIS — Z86010 Personal history of colonic polyps: Secondary | ICD-10-CM | POA: Diagnosis not present

## 2021-07-17 MED ORDER — TRULANCE 3 MG PO TABS: 3.0000 mg | ORAL_TABLET | Freq: Every day | ORAL | 5 refills | Status: DC

## 2021-07-17 NOTE — Telephone Encounter (Signed)
Patient has an appointment today with Hyacinth Meeker, PA .  She thanked me for the return call

## 2021-07-17 NOTE — Progress Notes (Signed)
Chief Complaint: Constipation  HPI:    Stacie Ferrell is a 75 year old female with past medical history as listed below including COPD and reflux, known to Dr. Leone Payor, who was referred to me by Romeo Rabon, MD for a complaint of constipation.      02/17/2019 colonoscopy by Dr. Allena Katz in [place] with 2 small subcentimeter adenomas in the cecum and adherent tortuous rectosigmoid colon with some diverticulosis.    04/04/2020 patient seen in clinic by Dr. Leone Payor for constipation.  At that time had a 4-day period of fairly severe constipation about 8 weeks prior.  She had been told to use MiraLAX to twice daily which she had titrated down to once daily.  Also had some fecal leakage occasionally.  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  At times recommend she stop MiraLAX and see how she does.  Discussed that she may need to titrate MiraLAX back up but she was not having troubles at that time.  It was discussed that she had diverticulosis and there can be some narrowing of the colon associated with that and the fact that her prior hysterectomy probably led to a fixed sigmoid colon.    Today, the patient tells me that she had done fairly well after seeing Dr. Leone Payor last, but over the past few months, at least 2-3 has started back with constipation noting that it is so severe that she has tried multiple over-the-counter products with minimal result.  Tells me that if she does get a stool it is "not much", and typically mushy or watery.  She feels like she needs to have a really good bowel movement.  Does have occasional pain in her right upper quadrant which she thinks is related to constipation as well as a feeling of being bloated and "fat".  Has tried over-the-counter products including MiraLAX  twice a day on a regular basis along with Dulcolax up to 2 a day, suppositories, enemas, milk of magnesia and prune juice.  Also tried Linzess 290 mcg daily for a month which made no change in her symptoms.    Denies fever, chills, blood in her stool or weight loss.  Past Medical History:  Diagnosis Date   Anxiety    Asthma    Chronic obstructive pulmonary disease (COPD) (HCC)    GERD (gastroesophageal reflux disease)    Hypercholesterolemia    Insomnia    Osteoarthrosis    Osteoporosis    Postmenopausal atrophic vaginitis    Tobacco use disorder     Past Surgical History:  Procedure Laterality Date   APPENDECTOMY  2007   BREAST BIOPSY     cecum volvulus abdominal surgery     COLONOSCOPY     ESOPHAGOGASTRODUODENOSCOPY     ingrown toenails     VAGINAL HYSTERECTOMY      Current Outpatient Medications  Medication Sig Dispense Refill   calcium carbonate (OSCAL) 1500 (600 Ca) MG TABS tablet Take by mouth 2 (two) times daily with a meal.     cholecalciferol (VITAMIN D3) 25 MCG (1000 UNIT) tablet Take 1,000 Units by mouth daily.     ciprofloxacin (CIPRO) 250 MG tablet Take 1 tablet (250 mg total) by mouth 2 (two) times daily. (Patient taking differently: Take 250 mg by mouth 2 (  two) times daily. As needed) 10 tablet 5   COLACE 100 MG capsule Take 200 mg by mouth at bedtime.     denosumab (PROLIA) 60 MG/ML SOSY injection Inject 60 mg into the skin every 6 (six) months.     diphenhydrAMINE (BENADRYL) 25 MG tablet Take 25 mg by mouth every 6 (six) hours as needed.     Multiple Vitamins-Minerals (ICAPS AREDS 2 PO) Take by mouth.     Multiple Vitamins-Minerals (SENIOR MULTIVITAMIN PLUS PO) Take by mouth.     omeprazole (PRILOSEC) 40 MG capsule Take 40 mg by mouth daily.     rosuvastatin (CRESTOR) 10 MG tablet Take 10 mg by mouth at bedtime.     traMADol (ULTRAM) 50 MG tablet Take by mouth every 6 (six) hours as needed.     No current facility-administered medications for this visit.     Allergies as of 07/17/2021 - Review Complete 07/17/2021  Allergen Reaction Noted   Codeine sulfate [codeine] Nausea Only 03/27/2020   Fosamax [alendronate] Other (See Comments) 03/27/2020   Daypro [oxaprozin] Rash 03/14/2020   Voltaren [diclofenac] Rash 03/27/2020    Family History  Problem Relation Age of Onset   Congestive Heart Failure Mother    COPD Mother    Cancer Mother    Colon cancer Father    Parkinson's disease Father     Social History   Socioeconomic History   Marital status: Widowed    Spouse name: Not on file   Number of children: 2   Years of education: Not on file   Highest education level: Not on file  Occupational History   Occupation: retired  Tobacco Use   Smoking status: Every Day    Packs/day: 0.50    Types: Cigarettes   Smokeless tobacco: Never  Vaping Use   Vaping Use: Never used  Substance and Sexual Activity   Alcohol use: Never   Drug use: Never   Sexual activity: Yes  Other Topics Concern   Not on file  Social History Narrative   Widowed and retired 2 children   She is a smoker but no alcohol or drug use   Social Determinants of Corporate investment banker Strain: Not on file  Food Insecurity: Not on file  Transportation Needs: Not on file  Physical Activity: Not on file  Stress: Not on file  Social Connections: Not on file  Intimate Partner Violence: Not on file    Review of Systems:    Constitutional: No weight loss, fever or chills Cardiovascular: No chest pain Respiratory: No SOB  Gastrointestinal: See HPI and otherwise negative   Physical Exam:  Vital signs: BP 118/81   Pulse 88   Ht 5\' 3"  (1.6 m)   Wt 139 lb 9.6 oz (63.3 kg)   SpO2 97%   BMI 24.73 kg/m   Constitutional:   Pleasant Elderly Caucasian female appears to be in NAD, Well developed, Well nourished, alert and cooperative Respiratory: Respirations even and unlabored. Lungs clear to auscultation bilaterally.   No wheezes, crackles, or rhonchi.   Cardiovascular: Normal S1, S2. No MRG. Regular rate and rhythm. No peripheral edema, cyanosis or pallor.  Gastrointestinal:  Soft, nondistended, nontender. No rebound or guarding. Normal bowel sounds. No appreciable masses or hepatomegaly. Psychiatric: Demonstrates good judgement and reason without abnormal affect or behaviors.  No recent labs or imaging.  Assessment: 1.  Constipation: Recent colonoscopy in 2020 with 2 adenomas and a tortuous colon, constipation discussed in the past and thought possibly  related to adhesions from hysterectomy+/- narrowing from diverticular disease+/- age 24.  Family history of colon cancer: In her father in his 14s  Plan: 1.  Ordered abdominal x-ray two-view today. 2.  Provided the patient with a Plenvu bowel prep sample to complete tomorrow after we get results from abdominal x-ray. 3.  Prescribed Trulance 3 mg p.o. daily as this was a preferred medicine by her insurance.  Would recommend she start this after bowel prep. 4.  Did briefly discussed that if medication cannot solve this problem for her then we may need to repeat a colonoscopy.  She is not eager to have another colonoscopy and would like to wait and see if we can work it out another way first. 5.  Patient to follow in clinic with me in 1 to 2 months.  She will call in the interim and let me know how she is doing.  Hyacinth Meeker, PA-C [facility] 07/17/2021, 2:34 PM  Cc: Romeo Rabon, MD

## 2021-07-17 NOTE — Patient Instructions (Addendum)
Your provider has requested that you have an abdominal x ray before leaving today. Please go to the basement floor to our Radiology department for the test.   We have sent the following medications to your pharmacy for you to pick up at your convenience: Trulance 3 mg daily.   Complete bowel prep per box instructions. Clear liquids day of bowel purge.   If you are age 75 or older, your body mass index should be between 23-30. Your Body mass index is 24.73 kg/m. If this is out of the aforementioned range listed, please consider follow up with your Primary Care Provider.  If you are age 4 or younger, your body mass index should be between 19-25. Your Body mass index is 24.73 kg/m. If this is out of the aformentioned range listed, please consider follow up with your Primary Care Provider.   __________________________________________________________  The [facility] providers would like to encourage you to use [facility] to communicate with providers for non-urgent requests or questions.  Due to long hold times on the telephone, sending your provider a message by [facility] may be a faster and more efficient way to get a response.  Please allow 48 business hours for a response.  Please remember that this is for non-urgent requests.

## 2021-07-18 ENCOUNTER — Telehealth: Payer: Self-pay | Admitting: Physician Assistant

## 2021-07-18 NOTE — Telephone Encounter (Signed)
Inbound call from patient requesting call back to discuss x ray results and about medication she is to start.

## 2021-07-19 NOTE — Telephone Encounter (Signed)
Spoke with patient patient would like to be informed of XRay results. Patient states she has completed bowel prep would like to know when she start eating.Patient has appt 9/20 with [facility]. Would also like to know when she start Truliance, advised patient of plan from Chart. Please advise,thank you   Last OV:7/20 Assessment: 1.  Constipation: Recent colonoscopy in 2020 with 2 adenomas and a tortuous colon, constipation discussed in the past and thought possibly related to adhesions from hysterectomy+/- narrowing from diverticular disease+/- age 55.  Family history of colon cancer: In her father in his 92s   Plan: 1.  Ordered abdominal x-ray two-view today. 2.  Provided the patient with a Plenvu bowel prep sample to complete tomorrow after we get results from abdominal x-ray. 3.  Prescribed Trulance 3 mg p.o. daily as this was a preferred medicine by her insurance.  Would recommend she start this after bowel prep. 4.  Did briefly discussed that if medication cannot solve this problem for her then we may need to repeat a colonoscopy.  She is not eager to have another colonoscopy and would like to wait and see if we can work it out another way first. 5.  Patient to follow in clinic with me in 1 to 2 months.  She will call in the interim and let me know how she is doing.   Hyacinth Meeker, PA-C

## 2021-07-19 NOTE — Telephone Encounter (Signed)
Sounds good, thank you 

## 2021-07-19 NOTE — Telephone Encounter (Signed)
Inbound call from patient. Following up about results of abd xray and also additional questions regarding cleanse she was given.

## 2021-07-19 NOTE — Telephone Encounter (Signed)
Is patient okay to being bowel cleanse?

## 2021-07-22 IMAGING — DX DG ABDOMEN 2V
2 series · 2 of 2 positions shown · non-contrast
Comparison: None.

CLINICAL DATA: constipation; intermittent constipation, gas and
bloating for 2 months

EXAM:
ABDOMEN - 2 VIEW

[abdomen erect]
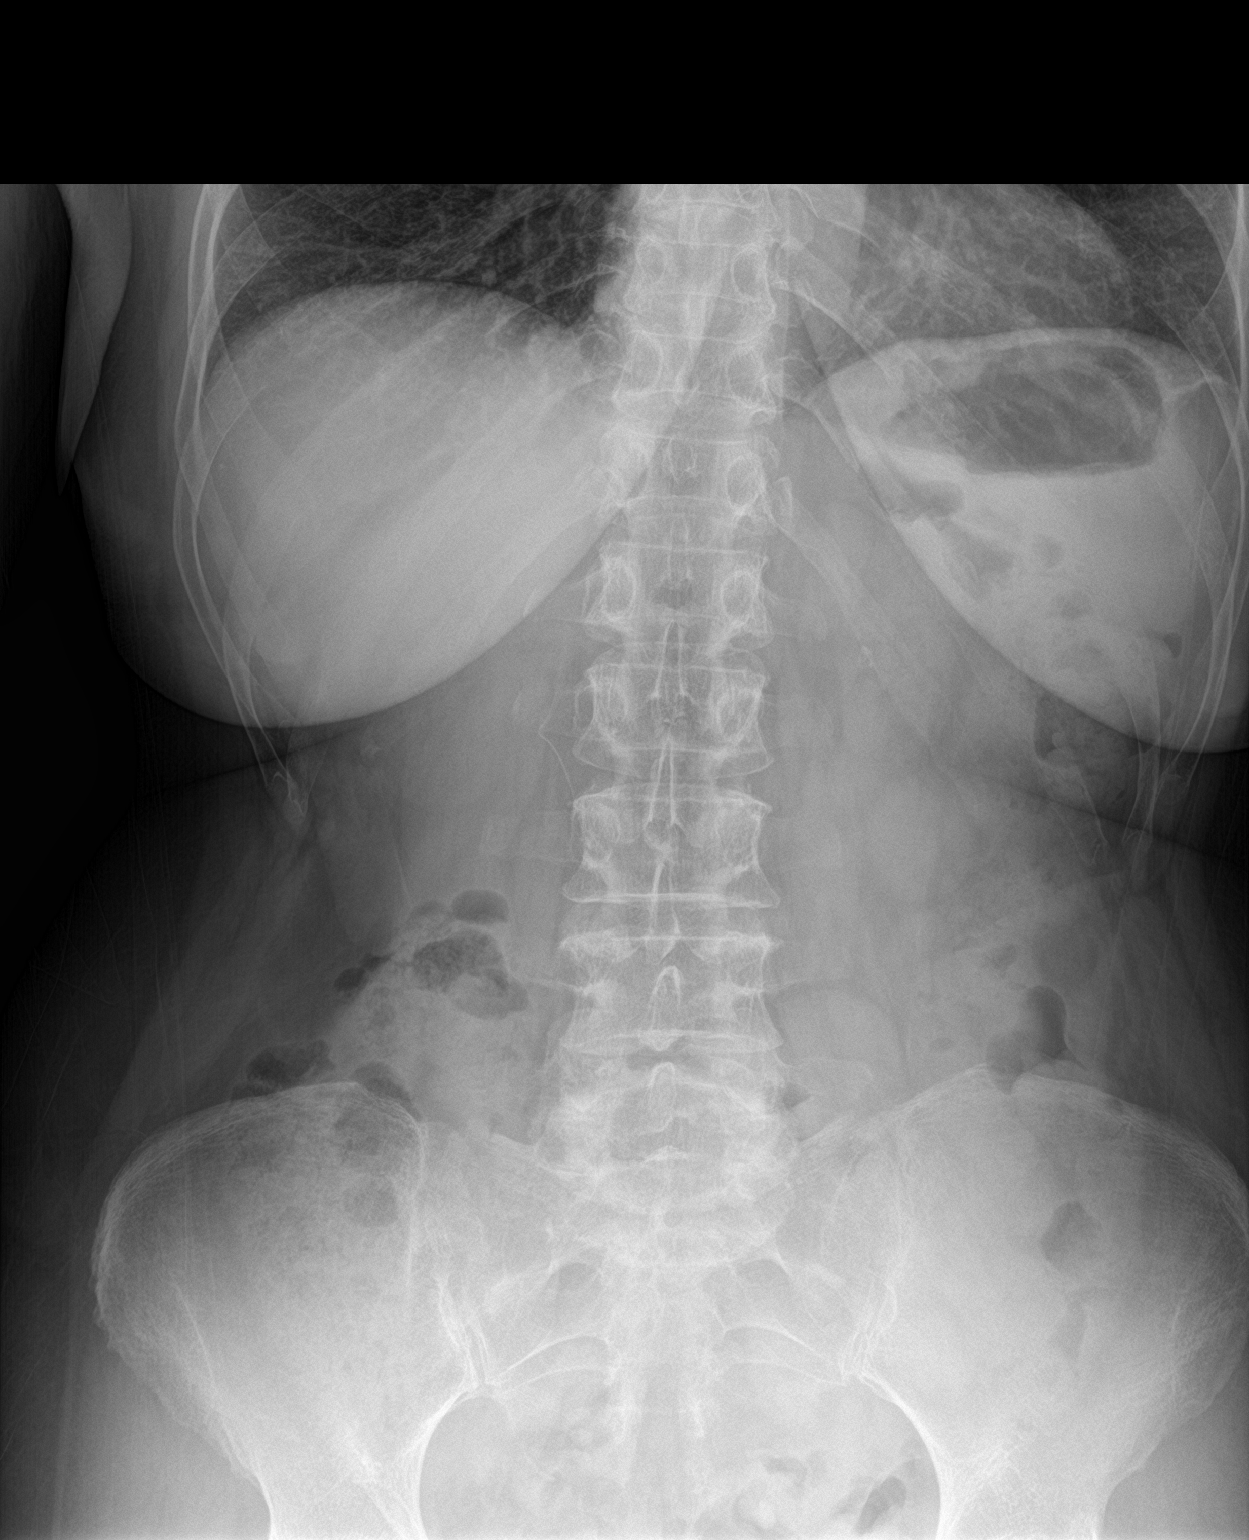

[abdomen supine]
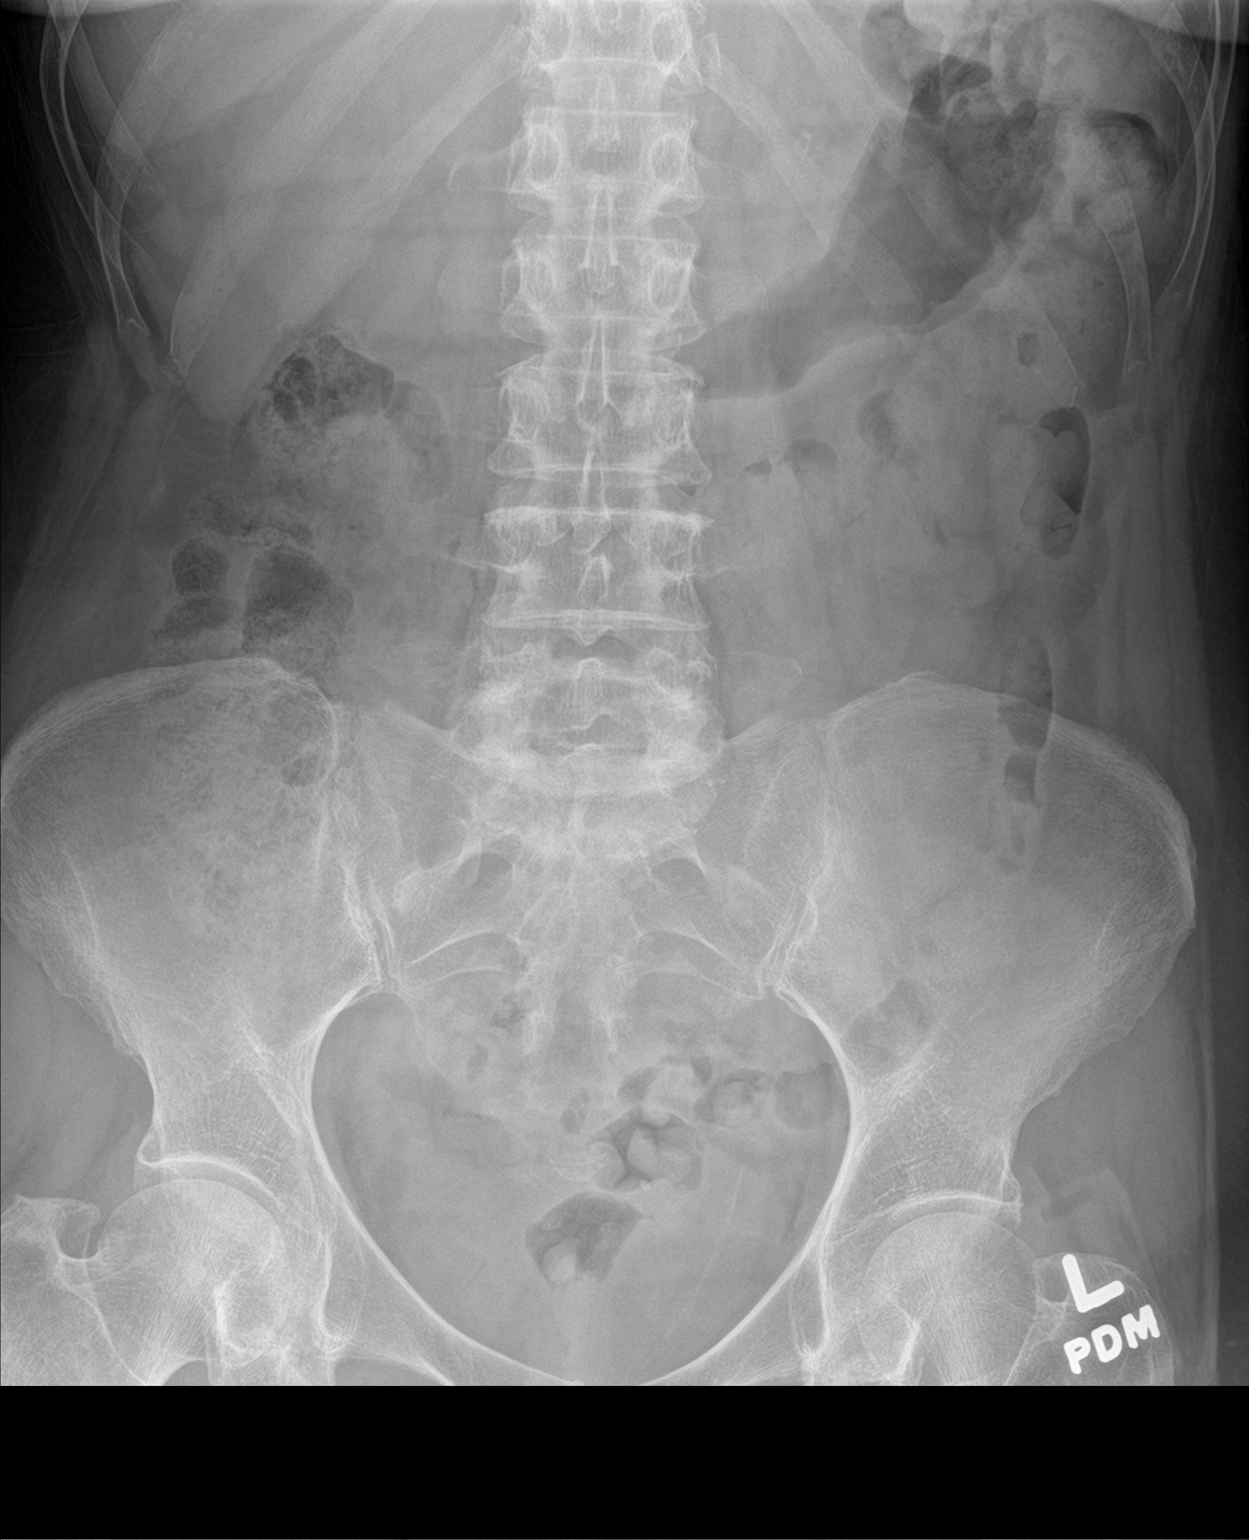

[2 of 2 positions shown; findings below may reference images not displayed]

FINDINGS: Air and stool-filled nondilated loops of bowel. No free air.
Moderate colonic stool burden predominately in the RIGHT and
transverse colon. Visualized lung bases are unremarkable. Mild
degenerative changes of the lumbar spine.
IMPRESSION: Nonobstructive bowel gas pattern. No free air

## 2021-08-08 ENCOUNTER — Other Ambulatory Visit: Payer: Self-pay | Admitting: *Deleted

## 2021-08-08 MED ORDER — TRULANCE 3 MG PO TABS: 3.0000 mg | ORAL_TABLET | Freq: Every day | ORAL | 1 refills | Status: DC

## 2021-09-17 ENCOUNTER — Ambulatory Visit: Payer: Self-pay | Admitting: Physician Assistant

## 2021-09-20 ENCOUNTER — Encounter: Payer: Self-pay | Admitting: Physician Assistant

## 2021-09-20 ENCOUNTER — Ambulatory Visit (INDEPENDENT_AMBULATORY_CARE_PROVIDER_SITE_OTHER): Payer: Medicare Other | Admitting: Physician Assistant

## 2021-09-20 VITALS — BP 120/70 | HR 76 | Ht 63.5 in | Wt 138.1 lb

## 2021-09-20 DIAGNOSIS — K5909 Other constipation: Secondary | ICD-10-CM | POA: Diagnosis not present

## 2021-09-20 MED ORDER — TRULANCE 3 MG PO TABS
3.0000 mg | ORAL_TABLET | Freq: Every day | ORAL | 3 refills | Status: DC
Start: 2021-09-20 — End: 2021-12-26

## 2021-09-20 NOTE — Patient Instructions (Signed)
It was my pleasure to provide care to you today. Based on our discussion, I am providing you with my recommendations below:  RECOMMENDATION(S):   PRESCRIPTION MEDICATION(S):   We have sent the following medication(s) to your pharmacy:  Trulance  NOTE: If your medication(s) requires a PRIOR AUTHORIZATION, we will receive notification from your pharmacy. Once received, the process to submit for approval may take up to 7-10 business days. You will be contacted about any denials we have received from your insurance company as well as alternatives recommended by your provider.  FOLLOW UP:  I would like for you to follow up with me as needed. Please call the office at [phone] to schedule your appointment.  BMI:  If you are age 60 or older, your body mass index should be between 23-30. Your Body mass index is 24.08 kg/m. If this is out of the aforementioned range listed, please consider follow up with your Primary Care Provider.  MY CHART:  The [place] [facility] providers would like to encourage you to use [facility] to communicate with providers for non-urgent requests or questions.  Due to long hold times on the telephone, sending your provider a message by [facility] may be a faster and more efficient way to get a response.  Please allow 48 business hours for a response.  Please remember that this is for non-urgent requests.   Thank you for trusting me with your gastrointestinal care!    Hyacinth Meeker, [place]

## 2021-09-20 NOTE — Progress Notes (Signed)
Chief Complaint: Follow-up constipation  HPI:    Stacie Ferrell is a 75 year old female with a past medical history as listed below including COPD and reflux, known to Dr. Carlean Purl, who returns to clinic today for follow-up of constipation.    02/17/2019 colonoscopy by Dr. Posey Pronto in [place] with 2 small subcentimeter adenomas in the cecum and adherent tortuous rectosigmoid colon with some diverticulosis.    04/04/2020 patient seen in clinic by Dr. Carlean Purl for constipation. At that time had a 4-day period of fairly severe constipation about 8 weeks prior. She had been told to use MiraLAX to twice daily which she had titrated down to once daily. Also had some fecal leakage occasionally. 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 At times recommend she stop MiraLAX and see how she does. Discussed that she may need to titrate MiraLAX back up but she was not having troubles at that time. It was discussed that she had diverticulosis and there can be some narrowing of the colon associated with that and the fact that her prior hysterectomy probably led to a fixed sigmoid colon.     07/17/2021 patient seen and described that over the past few months she has started back with some constipation.  At that time ordered abdominal x-ray two-view and gave her a Plenvu bowel prep.  Prescribed Trulance 3 mg p.o. daily.  At that time discussed that if medication does not fix the problem then we may need to repeat a colonoscopy.    07/17/2021 abdominal x-ray showed air and stool-filled nondilated loops of bowel, moderate colonic stool burden predominantly in the right and transverse colon.  At that time had already recommended she complete a bowel prep and start Trulance daily.    Today, the patient returns to clinic and  tells me that she is doing very well taking her Trulance in the morning and then typically 1-2 stool softeners at night.  Tells me that without the stool softener she will have a soft solid bowel movement once every day or once every other day, but this makes her slightly nervous if she skips a day because she "definitely does not want to get backed up".  For this reason she continues on stool softeners and tells me that occasionally she will have days with looser stools in the morning 3-4, but she does not care "as long as it is coming out".  Denies any further abdominal pain.    Does tell me the Trulance cost her $400-$500 for her first 30 pills, but she had not met her deductible yet, now her next 90 days will only cost $50.    Denies fever, chills, weight loss or any other new GI symptoms.  Past Medical History:  Diagnosis Date   Anxiety    Asthma    Chronic obstructive pulmonary disease (COPD) ([place])    GERD (gastroesophageal reflux disease)    Hypercholesterolemia    Insomnia    Osteoarthrosis    Osteoporosis    Postmenopausal atrophic vaginitis    Tobacco use disorder     Past Surgical History:  Procedure Laterality Date   APPENDECTOMY  2007   BREAST BIOPSY     cecum volvulus abdominal surgery     COLONOSCOPY     ESOPHAGOGASTRODUODENOSCOPY  ingrown toenails     VAGINAL HYSTERECTOMY      Current Outpatient Medications  Medication Sig Dispense Refill   calcium carbonate (OSCAL) 1500 (600 Ca) MG TABS tablet Take by mouth 2 (two) times daily with a meal.     cholecalciferol (VITAMIN D3) 25 MCG (1000 UNIT) tablet Take 1,000 Units by mouth daily.     ciprofloxacin (CIPRO) 250 MG tablet Take 1 tablet (250 mg total) by mouth 2 (two) times daily. (Patient taking differently: Take 250 mg by mouth 2 (two) times daily. As needed) 10 tablet 5   COLACE 100 MG capsule Take 200 mg by mouth at bedtime.     denosumab (PROLIA) 60 MG/ML SOSY injection Inject 60 mg into the skin every 6 (six)  months.     diphenhydrAMINE (BENADRYL) 25 MG tablet Take 25 mg by mouth every 6 (six) hours as needed.     Multiple Vitamins-Minerals (ICAPS AREDS 2 PO) Take by mouth.     Multiple Vitamins-Minerals (SENIOR MULTIVITAMIN PLUS PO) Take by mouth.     omeprazole (PRILOSEC) 40 MG capsule Take 40 mg by mouth daily.     Plecanatide (TRULANCE) 3 MG TABS Take 3 mg by mouth daily. 90 tablet 1   rosuvastatin (CRESTOR) 10 MG tablet Take 10 mg by mouth at bedtime.     traMADol (ULTRAM) 50 MG tablet Take by mouth every 6 (six) hours as needed.     No current facility-administered medications for this visit.    Allergies as of 09/20/2021 - Review Complete 07/17/2021  Allergen Reaction Noted   Codeine sulfate [codeine] Nausea Only 03/27/2020   Fosamax [alendronate] Other (See Comments) 03/27/2020   Daypro [oxaprozin] Rash 03/14/2020   Voltaren [diclofenac] Rash 03/27/2020    Family History  Problem Relation Age of Onset   Congestive Heart Failure Mother    COPD Mother    Cancer Mother    Colon cancer Father    Parkinson's disease Father     Social History   Socioeconomic History   Marital status: Widowed    Spouse name: Not on file   Number of children: 2   Years of education: Not on file   Highest education level: Not on file  Occupational History   Occupation: retired  Tobacco Use   Smoking status: Every Day    Packs/day: 0.50    Types: Cigarettes   Smokeless tobacco: Never  Vaping Use   Vaping Use: Never used  Substance and Sexual Activity   Alcohol use: Never   Drug use: Never   Sexual activity: Yes  Other Topics Concern   Not on file  Social History Narrative   Widowed and retired 2 children   She is a smoker but no alcohol or drug use   Social Determinants of Radio broadcast assistant Strain: Not on file  Food Insecurity: Not on file  Transportation Needs: Not on file  Physical Activity: Not on file  Stress: Not on file  Social Connections: Not on file   Intimate Partner Violence: Not on file    Review of Systems:    Constitutional: No weight loss, fever or chills Cardiovascular: No chest pain, chest pressure or palpitations   Respiratory: No SOB or cough Gastrointestinal: See HPI and otherwise negative   Physical Exam:  Vital signs: BP 120/70   Pulse 76   Ht 5' 3.5" (1.613 m)   Wt 138 lb 2 oz (62.7 kg)   BMI 24.08 kg/m    Constitutional:  Pleasant Elderly Caucasian female appears to be in NAD, Well developed, Well nourished, alert and cooperative Respiratory: Respirations even and unlabored. Lungs clear to auscultation bilaterally.   No wheezes, crackles, or rhonchi.  Cardiovascular: Normal S1, S2. No MRG. Regular rate and rhythm. No peripheral edema, cyanosis or pallor.  Gastrointestinal:  Soft, nondistended, nontender. No rebound or guarding. Normal bowel sounds. No appreciable masses or hepatomegaly. Rectal:  Not performed.  Psychiatric:  Demonstrates good judgement and reason without abnormal affect or behaviors.  No recent labs.  Assessment: 1.  Chronic constipation: Better now on Trulance once daily and 2 stool softeners at night; thought related to her fixed colon and diverticular disease/stricturing  Plan: 1.  Continue Trulance.  Refilled prescription #90 with 3 refills. 2.  Discussed stool softeners with the patient.  If these help her to feel better then she can continue to take them. 3.  Discussed with patient that likely she will need to be on something long-term for constipation given her fixed sigmoid colon and likely stricturing from diverticular disease. 4.  Patient to follow in clinic with [place] as needed.  Ellouise Newer, PA-C [facility] 09/20/2021, 11:13 AM  Cc: Moshe Cipro, MD

## 2021-12-26 ENCOUNTER — Telehealth: Payer: Self-pay | Admitting: Physician Assistant

## 2021-12-26 DIAGNOSIS — K5909 Other constipation: Secondary | ICD-10-CM

## 2021-12-26 MED ORDER — TRULANCE 3 MG PO TABS: 3.0000 mg | ORAL_TABLET | Freq: Every day | ORAL | 3 refills | Status: DC

## 2021-12-26 MED ORDER — TRULANCE 3 MG PO TABS
3.0000 mg | ORAL_TABLET | Freq: Every day | ORAL | 3 refills | Status: DC
Start: 2021-12-26 — End: 2022-01-20

## 2021-12-26 NOTE — Telephone Encounter (Signed)
Prescription sent into pharmacy [facility]). Left message informing patient script was sent to pharmacy.

## 2021-12-26 NOTE — Telephone Encounter (Signed)
Spoke with patient and she requested prescription be sent to [facility]. New script sent into mail order pharmacy.

## 2021-12-26 NOTE — Telephone Encounter (Signed)
Patient called requesting a refill on Trulance also stated that if it was sent in by today she would only pay $50.00 but if it is received after the new year she will need to pay $500.00 please call her to advise.

## 2021-12-26 NOTE — Addendum Note (Signed)
Addended by: Mariane Duval on: 12/26/2021 03:13 PM   Modules accepted: Orders

## 2022-01-20 ENCOUNTER — Other Ambulatory Visit: Payer: Self-pay | Admitting: Physician Assistant

## 2022-01-20 DIAGNOSIS — K5909 Other constipation: Secondary | ICD-10-CM

## 2022-03-03 ENCOUNTER — Ambulatory Visit: Payer: Medicare Other | Admitting: Urology

## 2022-03-07 ENCOUNTER — Ambulatory Visit: Payer: Medicare Other | Admitting: Urology

## 2022-03-10 ENCOUNTER — Encounter: Payer: Self-pay | Admitting: Urology

## 2022-03-10 ENCOUNTER — Ambulatory Visit (INDEPENDENT_AMBULATORY_CARE_PROVIDER_SITE_OTHER): Payer: Medicare Other | Admitting: Urology

## 2022-03-10 ENCOUNTER — Other Ambulatory Visit: Payer: Self-pay

## 2022-03-10 VITALS — BP 107/75 | HR 81

## 2022-03-10 DIAGNOSIS — R300 Dysuria: Secondary | ICD-10-CM

## 2022-03-10 DIAGNOSIS — N3021 Other chronic cystitis with hematuria: Secondary | ICD-10-CM

## 2022-03-10 LAB — URINALYSIS, ROUTINE W REFLEX MICROSCOPIC
Bilirubin, UA: NEGATIVE
Glucose, UA: NEGATIVE
Ketones, UA: NEGATIVE
Leukocytes,UA: NEGATIVE
Nitrite, UA: NEGATIVE
Protein,UA: NEGATIVE
RBC, UA: NEGATIVE
Specific Gravity, UA: 1.015 (ref 1.005–1.030)
Urobilinogen, Ur: 0.2 mg/dL (ref 0.2–1.0)
pH, UA: 5.5 (ref 5.0–7.5)

## 2022-03-10 MED ORDER — CIPROFLOXACIN HCL 250 MG PO TABS: 250.0000 mg | ORAL_TABLET | Freq: Two times a day (BID) | ORAL | 11 refills | Status: DC

## 2022-03-10 NOTE — Patient Instructions (Signed)
Urinary Tract Infection, Adult A urinary tract infection (UTI) is an infection of any part of the urinary tract. The urinary tract includes the kidneys, ureters, bladder, and urethra. These organs make, store, and get rid of urine in the body. An upper UTI affects the ureters and kidneys. A lower UTI affects the bladder and urethra. What are the causes? Most urinary tract infections are caused by bacteria in your genital area around your urethra, where urine leaves your body. These bacteria grow and cause inflammation of your urinary tract. What increases the risk? You are more likely to develop this condition if: You have a urinary catheter that stays in place. You are not able to control when you urinate or have a bowel movement (incontinence). You are female and you: Use a spermicide or diaphragm for birth control. Have low estrogen levels. Are pregnant. You have certain genes that increase your risk. You are sexually active. You take antibiotic medicines. You have a condition that causes your flow of urine to slow down, such as: An enlarged prostate, if you are female. Blockage in your urethra. A kidney stone. A nerve condition that affects your bladder control (neurogenic bladder). Not getting enough to drink, or not urinating often. You have certain medical conditions, such as: Diabetes. A weak disease-fighting system (immunesystem). Sickle cell disease. Gout. Spinal cord injury. What are the signs or symptoms? Symptoms of this condition include: Needing to urinate right away (urgency). Frequent urination. This may include small amounts of urine each time you urinate. Pain or burning with urination. Blood in the urine. Urine that smells bad or unusual. Trouble urinating. Cloudy urine. Vaginal discharge, if you are female. Pain in the abdomen or the lower back. You may also have: Vomiting or a decreased appetite. Confusion. Irritability or tiredness. A fever or  chills. Diarrhea. The first symptom in older adults may be confusion. In some cases, they may not have any symptoms until the infection has worsened. How is this diagnosed? This condition is diagnosed based on your medical history and a physical exam. You may also have other tests, including: Urine tests. Blood tests. Tests for STIs (sexually transmitted infections). If you have had more than one UTI, a cystoscopy or imaging studies may be done to determine the cause of the infections. How is this treated? Treatment for this condition includes: Antibiotic medicine. Over-the-counter medicines to treat discomfort. Drinking enough water to stay hydrated. If you have frequent infections or have other conditions such as a kidney stone, you may need to see a health care provider who specializes in the urinary tract (urologist). In rare cases, urinary tract infections can cause sepsis. Sepsis is a life-threatening condition that occurs when the body responds to an infection. Sepsis is treated in the hospital with IV antibiotics, fluids, and other medicines. Follow these instructions at home: Medicines Take over-the-counter and prescription medicines only as told by your health care provider. If you were prescribed an antibiotic medicine, take it as told by your health care provider. Do not stop using the antibiotic even if you start to feel better. General instructions Make sure you: Empty your bladder often and completely. Do not hold urine for long periods of time. Empty your bladder after sex. Wipe from front to back after urinating or having a bowel movement if you are female. Use each tissue only one time when you wipe. Drink enough fluid to keep your urine pale yellow. Keep all follow-up visits. This is important. Contact a health care provider   if: Your symptoms do not get better after 1-2 days. Your symptoms go away and then return. Get help right away if: You have severe pain in your  back or your lower abdomen. You have a fever or chills. You have nausea or vomiting. Summary A urinary tract infection (UTI) is an infection of any part of the urinary tract, which includes the kidneys, ureters, bladder, and urethra. Most urinary tract infections are caused by bacteria in your genital area. Treatment for this condition often includes antibiotic medicines. If you were prescribed an antibiotic medicine, take it as told by your health care provider. Do not stop using the antibiotic even if you start to feel better. Keep all follow-up visits. This is important. This information is not intended to replace advice given to you by your health care provider. Make sure you discuss any questions you have with your health care provider. Document Revised: 07/27/2020 Document Reviewed: 07/27/2020 Elsevier Patient Education  2022 [employer]vier Inc.  

## 2022-03-10 NOTE — Progress Notes (Signed)
? ?03/10/2022 ?11:43 AM  ? ?Stacie Ferrell ?Nov 01, 1946 ?[id number] ? ?Referring provider: Romeo Rabon, MD ?[address] ?[place],  [place] [address] ? ?Recurrent UTI ? ? ?HPI: ?Stacie Ferrell is a 76yo here for followup for recurrent UTI. No UTIs since last visit. No significant LUTS. She uses post coital cipro 250mg  which works well for her. No dysuria or gross hematuria. No other complaints today.  ? ? ?PMH: ?Past Medical History:  ?Diagnosis Date  ? Anxiety   ? Asthma   ? Chronic obstructive pulmonary disease (COPD) ([facility])   ? GERD (gastroesophageal reflux disease)   ? Hypercholesterolemia   ? Insomnia   ? Osteoarthrosis   ? Osteoporosis   ? Postmenopausal atrophic vaginitis   ? Tobacco use disorder   ? ? ?Surgical History: ?Past Surgical History:  ?Procedure Laterality Date  ? APPENDECTOMY  2007  ? BREAST BIOPSY    ? cecum volvulus abdominal surgery    ? COLONOSCOPY    ? ESOPHAGOGASTRODUODENOSCOPY    ? ingrown toenails    ? VAGINAL HYSTERECTOMY    ? ? ?Home Medications:  ?Allergies as of 03/10/2022   ? ?   Reactions  ? Codeine Sulfate [codeine] Nausea Only  ? On an empty stomach  ? Fosamax [alendronate] Other (See Comments)  ? GERD worse and bone ache  ? Daypro [oxaprozin] Rash  ? Voltaren [diclofenac] Rash  ? ?  ? ?  ?Medication List  ?  ? ?  ? Accurate as of March 10, 2022 11:43 AM. If you have any questions, ask your nurse or doctor.  ?  ?  ? ?  ? ?STOP taking these medications   ? ?traZODone 50 MG tablet ?Commonly known as: DESYREL ?Stopped by: March 12, 2022, MD ?  ? ?  ? ?TAKE these medications   ? ?calcium carbonate 1500 (600 Ca) MG Tabs tablet ?Commonly known as: OSCAL ?Take by mouth 2 (two) times daily with a meal. ?  ?cholecalciferol 25 MCG (1000 UNIT) tablet ?Commonly known as: VITAMIN D3 ?Take 1,000 Units by mouth daily. ?  ?ciprofloxacin 250 MG tablet ?Commonly known as: Cipro ?Take 1 tablet (250 mg total) by mouth 2 (two) times daily. ?What changed: additional instructions ?  ?Colace 100 MG  capsule ?Generic drug: docusate sodium ?Take 200 mg by mouth at bedtime. ?  ?diphenhydrAMINE 25 MG tablet ?Commonly known as: BENADRYL ?Take 25 mg by mouth every 6 (six) hours as needed. ?  ?omeprazole 40 MG capsule ?Commonly known as: PRILOSEC ?Take 40 mg by mouth daily. ?  ?Prolia 60 MG/ML Sosy injection ?Generic drug: denosumab ?Inject 60 mg into the skin every 6 (six) months. ?  ?rosuvastatin 10 MG tablet ?Commonly known as: CRESTOR ?Take 10 mg by mouth at bedtime. ?  ?SENIOR MULTIVITAMIN PLUS PO ?Take by mouth. ?  ?ICAPS AREDS 2 PO ?Take by mouth. ?  ?Trulance 3 MG Tabs ?Generic drug: Plecanatide ?TAKE 1 TABLET DAILY ?  ? ?  ? ? ?Allergies:  ?Allergies  ?Allergen Reactions  ? Codeine Sulfate [Codeine] Nausea Only  ?  On an empty stomach  ? Fosamax [Alendronate] Other (See Comments)  ?  GERD worse and bone ache  ? Daypro [Oxaprozin] Rash  ? Voltaren [Diclofenac] Rash  ? ? ?Family History: ?Family History  ?Problem Relation Age of Onset  ? Congestive Heart Failure Mother   ? COPD Mother   ? Cancer Mother   ? Colon cancer Father   ? Parkinson's disease Father   ? ? ?  Social History:  reports that she has been smoking cigarettes. She has been smoking an average of .5 packs per day. She has never used smokeless tobacco. She reports that she does not drink alcohol and does not use drugs. ? ?ROS: ?All other review of systems were reviewed and are negative except what is noted above in HPI ? ?Physical Exam: ?BP 107/75   Pulse 81   ?Constitutional:  Alert and oriented, No acute distress. ?HEENT: [place] AT, moist mucus membranes.  Trachea midline, no masses. ?Cardiovascular: No clubbing, cyanosis, or edema. ?Respiratory: Normal respiratory effort, no increased work of breathing. ?GI: Abdomen is soft, nontender, nondistended, no abdominal masses ?GU: No CVA tenderness.  ?Lymph: No cervical or inguinal lymphadenopathy. ?Skin: No rashes, bruises or suspicious lesions. ?Neurologic: Grossly intact, no focal deficits, moving all 4  extremities. ?Psychiatric: Normal mood and affect. ? ?Laboratory Data: ?No results found for: WBC, HGB, HCT, MCV, PLT ? ?No results found for: CREATININE ? ?No results found for: PSA ? ?No results found for: TESTOSTERONE ? ?No results found for: HGBA1C ? ?Urinalysis ?   ?Component Value Date/Time  ? APPEARANCEUR Clear 03/01/2021 1055  ? GLUCOSEU Negative 03/01/2021 1055  ? BILIRUBINUR Negative 03/01/2021 1055  ? PROTEINUR Negative 03/01/2021 1055  ? UROBILINOGEN 0.2 07/12/2020 1111  ? NITRITE Negative 03/01/2021 1055  ? LEUKOCYTESUR Negative 03/01/2021 1055  ? ? ?Lab Results  ?Component Value Date  ? LABMICR Comment 03/01/2021  ? ? ?Pertinent Imaging: ? ?No results found for this or any previous visit. ? ?No results found for this or any previous visit. ? ?No results found for this or any previous visit. ? ?No results found for this or any previous visit. ? ?No results found for this or any previous visit. ? ?No results found for this or any previous visit. ? ?No results found for this or any previous visit. ? ?No results found for this or any previous visit. ? ? ?Assessment & Plan:   ? ?1. Dysuria ?-resolved ? ?2. Chronic cystitis with hematuria ?-continue self start/ post coital cipro 250mg  ?- Urinalysis, Routine w reflex microscopic ? ? ?No follow-ups on file. ? ? , MD ? ?[facility] [place] ?  ?

## 2023-01-07 ENCOUNTER — Other Ambulatory Visit: Payer: Self-pay | Admitting: Physician Assistant

## 2023-01-07 DIAGNOSIS — K5909 Other constipation: Secondary | ICD-10-CM

## 2023-02-12 ENCOUNTER — Ambulatory Visit (INDEPENDENT_AMBULATORY_CARE_PROVIDER_SITE_OTHER): Payer: Medicare Other | Admitting: Physician Assistant

## 2023-02-12 ENCOUNTER — Encounter: Payer: Self-pay | Admitting: Physician Assistant

## 2023-02-12 VITALS — BP 122/68 | HR 72 | Ht 63.5 in | Wt 143.4 lb

## 2023-02-12 DIAGNOSIS — K5909 Other constipation: Secondary | ICD-10-CM

## 2023-02-12 MED ORDER — LUBIPROSTONE 8 MCG PO CAPS: 8.0000 ug | ORAL_CAPSULE | Freq: Two times a day (BID) | ORAL | 5 refills | Status: DC

## 2023-02-12 NOTE — Progress Notes (Signed)
Chief Complaint: Follow-up constipation  HPI:    Stacie Ferrell is a 77 year old African-American female with past medical history as listed below, known to Dr. Carlean Purl, who was referred to me by Moshe Cipro, MD for a follow-up of constipation.  02/17/2019 colonoscopy by Dr. Posey Pronto in [place] with 2 small subcentimeter adenomas in the cecum and adherent tortuous rectosigmoid colon with some diverticulosis.    04/04/2020 patient seen in clinic by Dr. Carlean Purl for constipation. At that time had a 4-day period of fairly severe constipation about 8 weeks prior. She had been told to use MiraLAX to twice daily which she had titrated down to once daily. Also had some fecal leakage occasionally. 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 At times recommend she stop MiraLAX and see how she does. Discussed that she may need to titrate MiraLAX back up but she was not having troubles at that time. It was discussed that she had diverticulosis and there can be some narrowing of the colon associated with that and the fact that her prior hysterectomy probably led to a fixed sigmoid colon.     07/17/2021 patient seen and described that over the past few months she has started back with some constipation.  At that time ordered abdominal x-ray two-view and gave her a Plenvu bowel prep.  Prescribed Trulance 3 mg p.o. daily.  At that time discussed that if medication does not fix the problem then we may need to repeat a colonoscopy.    07/17/2021 abdominal x-ray showed air and stool-filled nondilated loops of bowel, moderate colonic stool burden predominantly in the right and transverse colon.  At that time had already recommended she complete a bowel prep and start Trulance daily.    09/20/2021 patient was doing well  taking her Trulance in the morning and 1-2 stool softeners at night.  Continued on Trulance.    Today, the patient presents to clinic accompanied by her significant other.  She tells me that the Trulance does work but it works as a Management consultant.  Whenever she takes it  she has to be at home for at least the next 4 hours because she is never sure when this will kick in and it will urgently hit her that she needs to go to the bathroom and have watery explosive stool, sometimes 10 at a time.  After she does this then she is cleaned out and okay until the next time she takes the medicine.  Tells me she is overly worried about a possible bowel blockage because her mother died from this.  She wonders if there is anything else she can take that might work better for her.  Has tried Linzess in the past which was expensive and did not help.  Trulance is also expensive for her.    Denies fever, chills, weight loss, blood in her stool, nausea, vomiting or abdominal pain.  Past Medical History:  Diagnosis Date   Anxiety    Asthma    Chronic obstructive pulmonary disease (COPD) (HCC)    GERD (gastroesophageal reflux disease)    Hypercholesterolemia    Insomnia    Osteoarthrosis    Osteoporosis    Postmenopausal atrophic vaginitis    Tobacco use disorder     Past Surgical History:  Procedure Laterality Date   APPENDECTOMY  2007   BREAST BIOPSY     cecum volvulus abdominal surgery     COLONOSCOPY     ESOPHAGOGASTRODUODENOSCOPY     ingrown toenails     VAGINAL HYSTERECTOMY      Current Outpatient Medications  Medication Sig Dispense Refill   calcium carbonate (OSCAL) 1500 (600 Ca) MG TABS tablet Take by mouth 2 (two) times daily with a meal.     cholecalciferol (VITAMIN D3) 25 MCG (1000 UNIT) tablet Take 1,000 Units by mouth daily.     ciprofloxacin (CIPRO) 250 MG tablet Take 1 tablet (250 mg total) by mouth 2 (two) times daily. As needed 10 tablet 11   COLACE 100 MG capsule Take 200 mg by  mouth at bedtime.     denosumab (PROLIA) 60 MG/ML SOSY injection Inject 60 mg into the skin every 6 (six) months.     diphenhydrAMINE (BENADRYL) 25 MG tablet Take 25 mg by mouth every 6 (six) hours as needed.     Multiple Vitamins-Minerals (ICAPS AREDS 2 PO) Take by mouth.     Multiple Vitamins-Minerals (SENIOR MULTIVITAMIN PLUS PO) Take by mouth.     omeprazole (PRILOSEC) 40 MG capsule Take 40 mg by mouth daily.     Plecanatide (TRULANCE) 3 MG TABS TAKE 1 TABLET DAILY 90 tablet 0   rosuvastatin (CRESTOR) 10 MG tablet Take 10 mg by mouth at bedtime.     No current facility-administered medications for this visit.    Allergies as of 02/12/2023 - Review Complete 03/10/2022  Allergen Reaction Noted   Codeine sulfate [codeine] Nausea Only 03/27/2020   Fosamax [alendronate] Other (See Comments) 03/27/2020   Daypro [oxaprozin] Rash 03/14/2020   Voltaren [diclofenac] Rash 03/27/2020    Family History  Problem Relation Age of Onset   Congestive Heart Failure Mother    COPD Mother    Cancer Mother    Colon cancer Father    Parkinson's disease Father     Social History   Socioeconomic History   Marital status: Widowed    Spouse name: Not on file   Number of children: 2   Years of education: Not on file   Highest education level: Not on file  Occupational History   Occupation: retired  Tobacco Use   Smoking status: Every Day    Packs/day: 0.50    Types: Cigarettes   Smokeless tobacco: Never  Vaping Use   Vaping Use: Never used  Substance and Sexual Activity   Alcohol use: Never   Drug use: Never   Sexual activity: Yes  Other Topics Concern   Not on file  Social History Narrative   Widowed and retired 2 children   She is a smoker but no alcohol or drug use   Social Determinants of Radio broadcast assistant Strain: Not on file  Food Insecurity: Not on file  Transportation Needs: Not on file  Physical Activity: Not on file  Stress: Not on file  Social Connections:  Not on file  Intimate Partner Violence: Not on file    Review of Systems:    Constitutional: No weight loss, fever or chills Cardiovascular: No chest pain Respiratory: No SOB  Gastrointestinal: See HPI and otherwise negative   Physical Exam:  Vital signs: BP 122/68   Pulse 72   Ht 5' 3.5" (1.613 m)   Wt 143 lb 7 oz (65.1 kg)   BMI 25.01 kg/m    Constitutional:   Pleasant Elderly Caucasian female  appears to be in NAD, Well developed, Well nourished, alert and cooperative Respiratory: Respirations even and unlabored. Lungs clear to auscultation bilaterally.   No wheezes, crackles, or rhonchi.  Cardiovascular: Normal S1, S2. No MRG. Regular rate and rhythm. No peripheral edema, cyanosis or pallor.  Gastrointestinal:  Soft, nondistended, nontender. No rebound or guarding. Normal bowel sounds. No appreciable masses or hepatomegaly. Rectal:  Not performed.  Psychiatric: Oriented to person, place and time. Demonstrates good judgement and reason without abnormal affect or behaviors.  No recent labs or imaging.  Assessment: 1.  Chronic constipation: Was previously better on Trulance, now Trulance sounds too strong for her, failed Linzess and MiraLAX ;thought related to refix colon and diverticular disease/stricturing  Plan: 1.  We will trial Amitiza 8 mcg twice daily with food.  Prescribed #60 with 3 refills.  Patient will call and let us know if this is too expensive at that point would try to get this approved through insurance. 2.  For now patient can take her Trulance 1 tab every 2 to 3 days for constipation.  She just bought a 90-day supply for over $500. 3.  Patient to follow in clinic with me in 6 to 8 weeks or sooner if necessary.  Ellouise Newer, PA-C [facility] 02/12/2023, 11:15 AM  Cc: Moshe Cipro, MD

## 2023-02-12 NOTE — Patient Instructions (Signed)
_______________________________________________________  If your blood pressure at your visit was 140/90 or greater, please contact your primary care physician to follow up on this.  _______________________________________________________  If you are age 77 or older, your body mass index should be between 23-30. Your Body mass index is 25.01 kg/m. If this is out of the aforementioned range listed, please consider follow up with your Primary Care Provider.  If you are age 26 or younger, your body mass index should be between 19-25. Your Body mass index is 25.01 kg/m. If this is out of the aformentioned range listed, please consider follow up with your Primary Care Provider.   ________________________________________________________  The [facility] providers would like to encourage you to use [facility] to communicate with providers for non-urgent requests or questions.  Due to long hold times on the telephone, sending your provider a message by [facility] may be a faster and more efficient way to get a response.  Please allow 48 business hours for a response.  Please remember that this is for non-urgent requests.  _______________________________________________________  We have sent the following medications to your pharmacy for you to pick up at your convenience: Amitiza 8 mcg

## 2023-02-23 ENCOUNTER — Telehealth: Payer: Self-pay | Admitting: Physician Assistant

## 2023-02-23 NOTE — Telephone Encounter (Signed)
Inbound call from pt regarding medication lubiprostone , she wantt o know if she get have a large dosis of medication because is not working for her .Please advise

## 2023-02-24 NOTE — Telephone Encounter (Signed)
Please advise 

## 2023-02-26 MED ORDER — LUBIPROSTONE 24 MCG PO CAPS: 24.0000 ug | ORAL_CAPSULE | Freq: Two times a day (BID) | ORAL | 3 refills | Status: DC

## 2023-02-26 NOTE — Telephone Encounter (Signed)
Left message for patient to call office. Script sent into [facility] for Amitiza 24 mcg.

## 2023-02-26 NOTE — Telephone Encounter (Signed)
Patient informed of change. 

## 2023-03-11 ENCOUNTER — Encounter: Payer: Self-pay | Admitting: Urology

## 2023-03-11 ENCOUNTER — Ambulatory Visit (INDEPENDENT_AMBULATORY_CARE_PROVIDER_SITE_OTHER): Payer: Medicare Other | Admitting: Urology

## 2023-03-11 VITALS — BP 123/81 | HR 80 | Ht 63.5 in | Wt 144.2 lb

## 2023-03-11 DIAGNOSIS — N3021 Other chronic cystitis with hematuria: Secondary | ICD-10-CM

## 2023-03-11 LAB — MICROSCOPIC EXAMINATION: WBC, UA: >30 /hpf — AB (ref 0–5)

## 2023-03-11 LAB — URINALYSIS, ROUTINE W REFLEX MICROSCOPIC
Bilirubin, UA: NEGATIVE
Glucose, UA: NEGATIVE
Ketones, UA: NEGATIVE
Nitrite, UA: POSITIVE — AB
Protein,UA: NEGATIVE
Specific Gravity, UA: 1.015 (ref 1.005–1.030)
Urobilinogen, Ur: 0.2 mg/dL (ref 0.2–1.0)
pH, UA: 7 (ref 5.0–7.5)

## 2023-03-11 MED ORDER — CIPROFLOXACIN HCL 250 MG PO TABS: 250.0000 mg | ORAL_TABLET | Freq: Two times a day (BID) | ORAL | 11 refills | Status: DC

## 2023-03-11 NOTE — Progress Notes (Signed)
03/11/2023 10:51 AM   Stacie Ferrell 11/15/1946 [id number]  Referring provider: Moshe Cipro, MD [address],  [place] [address]  Followup recurrent UTi   HPI: [name] is a 77yo here for followup for recurrent UTI. No UTIs since last visit. She uses post coital cipro '250mg'$  prn. No significant LUTS. No hematuria or dysuria. No other complaints today   PMH: Past Medical History:  Diagnosis Date   Anxiety    Asthma    Chronic obstructive pulmonary disease (COPD) ([place])    GERD (gastroesophageal reflux disease)    Hypercholesterolemia    Insomnia    Osteoarthrosis    Osteoporosis    Postmenopausal atrophic vaginitis    Tobacco use disorder     Surgical History: Past Surgical History:  Procedure Laterality Date   APPENDECTOMY  2007   BREAST BIOPSY     cecum volvulus abdominal surgery     COLONOSCOPY     ESOPHAGOGASTRODUODENOSCOPY     ingrown toenails     VAGINAL HYSTERECTOMY      Home Medications:  Allergies as of 03/11/2023       Reactions   Codeine Sulfate [codeine] Nausea Only   On an empty stomach   Fosamax [alendronate] Other (See Comments)   GERD worse and bone ache   Daypro [oxaprozin] Rash   Voltaren [diclofenac] Rash        Medication List        Accurate as of March 11, 2023 10:51 AM. If you have any questions, ask your nurse or doctor.          calcium carbonate 1500 (600 Ca) MG Tabs tablet Commonly known as: OSCAL Take by mouth 2 (two) times daily with a meal.   cholecalciferol 25 MCG (1000 UNIT) tablet Commonly known as: VITAMIN D3 Take 1,000 Units by mouth daily.   ciprofloxacin 250 MG tablet Commonly known as: Cipro Take 1 tablet (250 mg total) by mouth 2 (two) times daily. As needed   Colace 100 MG capsule Generic drug: docusate sodium Take 200 mg by mouth at bedtime.   diphenhydrAMINE 25 MG tablet Commonly known as: BENADRYL Take 25 mg by mouth every 6 (six) hours as needed.   lubiprostone 24 MCG  capsule Commonly known as: AMITIZA Take 1 capsule (24 mcg total) by mouth 2 (two) times daily with a meal.   omeprazole 40 MG capsule Commonly known as: PRILOSEC Take 40 mg by mouth daily.   Prolia 60 MG/ML Sosy injection Generic drug: denosumab Inject 60 mg into the skin every 6 (six) months.   rosuvastatin 10 MG tablet Commonly known as: CRESTOR Take 10 mg by mouth at bedtime.   SENIOR MULTIVITAMIN PLUS PO Take by mouth.   ICAPS AREDS 2 PO Take by mouth.   Trulance 3 MG Tabs Generic drug: Plecanatide TAKE 1 TABLET DAILY        Allergies:  Allergies  Allergen Reactions   Codeine Sulfate [Codeine] Nausea Only    On an empty stomach   Fosamax [Alendronate] Other (See Comments)    GERD worse and bone ache   Daypro [Oxaprozin] Rash   Voltaren [Diclofenac] Rash    Family History: Family History  Problem Relation Age of Onset   Congestive Heart Failure Mother    COPD Mother    Cancer Mother    Colon cancer Father    Parkinson's disease Father     Social History:  reports that she has been smoking cigarettes. She has been  smoking an average of .5 packs per day. She has never used smokeless tobacco. She reports that she does not drink alcohol and does not use drugs.  ROS: All other review of systems were reviewed and are negative except what is noted above in HPI  Physical Exam: BP 123/81   Pulse 80   Ht 5' 3.5" (1.613 m)   Wt 144 lb 3.2 oz (65.4 kg)   BMI 25.14 kg/m   Constitutional:  Alert and oriented, No acute distress. HEENT: [place] AT, moist mucus membranes.  Trachea midline, no masses. Cardiovascular: No clubbing, cyanosis, or edema. Respiratory: Normal respiratory effort, no increased work of breathing. GI: Abdomen is soft, nontender, nondistended, no abdominal masses GU: No CVA tenderness.  Lymph: No cervical or inguinal lymphadenopathy. Skin: No rashes, bruises or suspicious lesions. Neurologic: Grossly intact, no focal deficits, moving all 4  extremities. Psychiatric: Normal mood and affect.  Laboratory Data: No results found for: "WBC", "HGB", "HCT", "MCV", "PLT"  No results found for: "CREATININE"  No results found for: "PSA"  No results found for: "TESTOSTERONE"  No results found for: "HGBA1C"  Urinalysis    Component Value Date/Time   APPEARANCEUR Clear 03/10/2022 1149   GLUCOSEU Negative 03/10/2022 1149   BILIRUBINUR Negative 03/10/2022 1149   PROTEINUR Negative 03/10/2022 1149   UROBILINOGEN 0.2 07/12/2020 1111   NITRITE Negative 03/10/2022 1149   LEUKOCYTESUR Negative 03/10/2022 1149    Lab Results  Component Value Date   LABMICR Comment 03/10/2022    Pertinent Imaging:  No results found for this or any previous visit.  No results found for this or any previous visit.  No results found for this or any previous visit.  No results found for this or any previous visit.  No results found for this or any previous visit.  No valid procedures specified. No results found for this or any previous visit.  No results found for this or any previous visit.   Assessment & Plan:    1. Chronic cystitis with hematuria -continue post coital cipro prophylaxis. - Urinalysis, Routine w reflex microscopic   No follow-ups on file.  Nicolette Bang, MD  [facility] [place]

## 2023-03-11 NOTE — Patient Instructions (Signed)
Urinary Tract Infection, Adult  A urinary tract infection (UTI) is an infection of any part of the urinary tract. The urinary tract includes the kidneys, ureters, bladder, and urethra. These organs make, store, and get rid of urine in the body. An upper UTI affects the ureters and kidneys. A lower UTI affects the bladder and urethra. What are the causes? Most urinary tract infections are caused by bacteria in your genital area around your urethra, where urine leaves your body. These bacteria grow and cause inflammation of your urinary tract. What increases the risk? You are more likely to develop this condition if: You have a urinary catheter that stays in place. You are not able to control when you urinate or have a bowel movement (incontinence). You are female and you: Use a spermicide or diaphragm for birth control. Have low estrogen levels. Are pregnant. You have certain genes that increase your risk. You are sexually active. You take antibiotic medicines. You have a condition that causes your flow of urine to slow down, such as: An enlarged prostate, if you are female. Blockage in your urethra. A kidney stone. A nerve condition that affects your bladder control (neurogenic bladder). Not getting enough to drink, or not urinating often. You have certain medical conditions, such as: Diabetes. A weak disease-fighting system (immunesystem). Sickle cell disease. Gout. Spinal cord injury. What are the signs or symptoms? Symptoms of this condition include: Needing to urinate right away (urgency). Frequent urination. This may include small amounts of urine each time you urinate. Pain or burning with urination. Blood in the urine. Urine that smells bad or unusual. Trouble urinating. Cloudy urine. Vaginal discharge, if you are female. Pain in the abdomen or the lower back. You may also have: Vomiting or a decreased appetite. Confusion. Irritability or tiredness. A fever or  chills. Diarrhea. The first symptom in older adults may be confusion. In some cases, they may not have any symptoms until the infection has worsened. How is this diagnosed? This condition is diagnosed based on your medical history and a physical exam. You may also have other tests, including: Urine tests. Blood tests. Tests for STIs (sexually transmitted infections). If you have had more than one UTI, a cystoscopy or imaging studies may be done to determine the cause of the infections. How is this treated? Treatment for this condition includes: Antibiotic medicine. Over-the-counter medicines to treat discomfort. Drinking enough water to stay hydrated. If you have frequent infections or have other conditions such as a kidney stone, you may need to see a health care provider who specializes in the urinary tract (urologist). In rare cases, urinary tract infections can cause sepsis. Sepsis is a life-threatening condition that occurs when the body responds to an infection. Sepsis is treated in the hospital with IV antibiotics, fluids, and other medicines. Follow these instructions at home:  Medicines Take over-the-counter and prescription medicines only as told by your health care provider. If you were prescribed an antibiotic medicine, take it as told by your health care provider. Do not stop using the antibiotic even if you start to feel better. General instructions Make sure you: Empty your bladder often and completely. Do not hold urine for long periods of time. Empty your bladder after sex. Wipe from front to back after urinating or having a bowel movement if you are female. Use each tissue only one time when you wipe. Drink enough fluid to keep your urine pale yellow. Keep all follow-up visits. This is important. Contact a health   care provider if: Your symptoms do not get better after 1-2 days. Your symptoms go away and then return. Get help right away if: You have severe pain in  your back or your lower abdomen. You have a fever or chills. You have nausea or vomiting. Summary A urinary tract infection (UTI) is an infection of any part of the urinary tract, which includes the kidneys, ureters, bladder, and urethra. Most urinary tract infections are caused by bacteria in your genital area. Treatment for this condition often includes antibiotic medicines. If you were prescribed an antibiotic medicine, take it as told by your health care provider. Do not stop using the antibiotic even if you start to feel better. Keep all follow-up visits. This is important. This information is not intended to replace advice given to you by your health care provider. Make sure you discuss any questions you have with your health care provider. Document Revised: 07/27/2020 Document Reviewed: 07/27/2020 Elsevier Patient Education  2023 [employer]  

## 2023-03-13 LAB — URINE CULTURE

## 2023-03-16 ENCOUNTER — Telehealth: Payer: Self-pay

## 2023-03-16 NOTE — Telephone Encounter (Signed)
Patient is aware that urine culture results was sent to the MD 03/15 and once he review results someone will reach back out to her with MD response. Patient voiced understanding

## 2023-03-17 ENCOUNTER — Encounter: Payer: Self-pay | Admitting: Urology

## 2023-03-20 ENCOUNTER — Ambulatory Visit (INDEPENDENT_AMBULATORY_CARE_PROVIDER_SITE_OTHER): Payer: Medicare Other | Admitting: Physician Assistant

## 2023-03-20 ENCOUNTER — Encounter: Payer: Self-pay | Admitting: Physician Assistant

## 2023-03-20 VITALS — BP 120/70 | HR 80 | Ht 63.5 in | Wt 150.5 lb

## 2023-03-20 DIAGNOSIS — K5909 Other constipation: Secondary | ICD-10-CM

## 2023-03-20 NOTE — Progress Notes (Signed)
Chief Complaint: Chronic constipation  HPI:    Stacie Ferrell is a 77 year old female with a past medical history as listed below, known to Dr. Carlean Purl, who returns to clinic today for follow-up of her constipation.      02/17/2019 colonoscopy by Dr. Posey Pronto in [place] with 2 small subcentimeter adenomas in the cecum and adherent tortuous rectosigmoid colon with some diverticulosis.    04/04/2020 patient seen in clinic by Dr. Carlean Purl for constipation. At that time had a 4-day period of fairly severe constipation about 8 weeks prior. She had been told to use MiraLAX to twice daily which she had titrated down to once daily. Also had some fecal leakage occasionally. 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 At times recommend she stop MiraLAX and see how she does. Discussed that she may need to titrate MiraLAX back up but she was not having troubles at that time. It was discussed that she had diverticulosis and there can be some narrowing of the colon associated with that and the fact that her prior hysterectomy probably led to a fixed sigmoid colon.     07/17/2021 patient seen and described that over the past few months she has started back with some constipation.  At that time ordered abdominal x-ray two-view and gave her a Plenvu bowel prep.  Prescribed Trulance 3 mg p.o. daily.  At that time discussed that if medication does not fix the problem then we may need to repeat a colonoscopy.    07/17/2021 abdominal x-ray showed air and stool-filled nondilated loops of bowel, moderate colonic stool burden predominantly in the right and transverse colon.  At that time had already recommended she complete a bowel prep and start Trulance daily.    09/20/2021 patient was doing well taking her Trulance in the  morning and 1-2 stool softeners at night.  Continued on Trulance.    02/12/2023 patient seen in clinic and at that time discussed that the Trulance did work but it was more of a bowel purge/cleanse and she could not leave the house for 4 hours after taking it.  That time is because she had tried Linzess in the past which was too expensive.  Trulance is also expensive.  At that time trialed Amitiza 8 mcg twice daily with food.    02/26/2023 patient called and is requesting a higher dose of Amitiza, 24 mcg twice daily was sent to [facility] for her.    Today, the patient returns to clinic accompanied by her significant other, tells me that she tried the Amitiza 24 mcg twice a day for a month and a half, but it did not help her at all.  She ended up taking a Trulance on the third or fourth day after having no bowel movements every week because she was worried about becoming "obstructed".  She is growing weary of the process of trying different regimens, but is happy that the Trulance will at least clear her out even though sometimes this takes 4 hours and she cannot leave her house.    History of abdominal surgery in her 5s, otherwise no further abdominal surgeries.  Discussed that her risk of bowel obstruction is low.  She is worried about this given her mother's history.    Denies fever,  chills, weight loss, blood in her stool, nausea or vomiting.     Past Medical History:  Diagnosis Date   Anxiety    Asthma    Chronic obstructive pulmonary disease (COPD) ([place])    GERD (gastroesophageal reflux disease)    Hypercholesterolemia    Insomnia    Osteoarthrosis    Osteoporosis    Postmenopausal atrophic vaginitis    Tobacco use disorder     Past Surgical History:  Procedure Laterality Date   APPENDECTOMY  2007   BREAST BIOPSY     cecum volvulus abdominal surgery     COLONOSCOPY     ESOPHAGOGASTRODUODENOSCOPY     ingrown toenails     VAGINAL HYSTERECTOMY      Current Outpatient Medications   Medication Sig Dispense Refill   Baclofen 5 MG TABS Take 1 tablet by mouth as needed.     calcium carbonate (OSCAL) 1500 (600 Ca) MG TABS tablet Take by mouth 2 (two) times daily with a meal.     cholecalciferol (VITAMIN D3) 25 MCG (1000 UNIT) tablet Take 1,000 Units by mouth daily.     ciprofloxacin (CIPRO) 250 MG tablet Take 1 tablet (250 mg total) by mouth 2 (two) times daily. As needed 10 tablet 11   denosumab (PROLIA) 60 MG/ML SOSY injection Inject 60 mg into the skin every 6 (six) months.     diphenhydrAMINE (BENADRYL) 25 MG tablet Take 25 mg by mouth every 6 (six) hours as needed.     lubiprostone (AMITIZA) 24 MCG capsule Take 1 capsule (24 mcg total) by mouth 2 (two) times daily with a meal. 60 capsule 3   Multiple Vitamins-Minerals (ICAPS AREDS 2 PO) Take by mouth.     Multiple Vitamins-Minerals (SENIOR MULTIVITAMIN PLUS PO) Take by mouth.     omeprazole (PRILOSEC) 40 MG capsule Take 40 mg by mouth as needed.     Plecanatide (TRULANCE) 3 MG TABS TAKE 1 TABLET DAILY 90 tablet 0   rosuvastatin (CRESTOR) 10 MG tablet Take 10 mg by mouth at bedtime.     traMADol (ULTRAM) 50 MG tablet Take 25 mg by mouth as needed.     No current facility-administered medications for this visit.    Allergies as of 03/20/2023 - Review Complete 03/20/2023  Allergen Reaction Noted   Amoxicillin-pot clavulanate  03/20/2023   Codeine sulfate [codeine] Nausea Only 03/27/2020   Fosamax [alendronate] Other (See Comments) 03/27/2020   Lipitor [atorvastatin] Other (See Comments) 03/20/2023   Daypro [oxaprozin] Rash 03/14/2020   Voltaren [diclofenac] Rash 03/27/2020    Family History  Problem Relation Age of Onset   Congestive Heart Failure Mother    COPD Mother    Cancer Mother    Colon cancer Father    Parkinson's disease Father     Social History   Socioeconomic History   Marital status: Widowed    Spouse name: Not on file   Number of children: 2   Years of education: Not on file   Highest  education level: Not on file  Occupational History   Occupation: retired  Tobacco Use   Smoking status: Every Day    Packs/day: .5    Types: Cigarettes   Smokeless tobacco: Never  Vaping Use   Vaping Use: Never used  Substance and Sexual Activity   Alcohol use: Never   Drug use: Never   Sexual activity: Yes  Other Topics Concern   Not on file  Social History Narrative   Widowed and retired 2 children  She is a smoker but no alcohol or drug use   Social Determinants of Radio broadcast assistant Strain: Not on file  Food Insecurity: Not on file  Transportation Needs: Not on file  Physical Activity: Not on file  Stress: Not on file  Social Connections: Not on file  Intimate Partner Violence: Not on file    Review of Systems:    Constitutional: No weight loss, fever or chills Cardiovascular: No chest pain  Respiratory: No SOB Gastrointestinal: See HPI and otherwise negative   Physical Exam:  Vital signs: BP 120/70 (BP Location: Left Arm, Patient Position: Sitting, Cuff Size: Normal)   Pulse 80   Ht 5' 3.5" (1.613 m)   Wt 150 lb 8 oz (68.3 kg)   BMI 26.24 kg/m    Constitutional:   Pleasant Caucasian female appears to be in NAD, Well developed, Well nourished, alert and cooperative Respiratory: Respirations even and unlabored. Lungs clear to auscultation bilaterally.   No wheezes, crackles, or rhonchi.  Cardiovascular: Normal S1, S2. No MRG. Regular rate and rhythm. No peripheral edema, cyanosis or pallor.  Gastrointestinal:  Soft, nondistended, nontender. No rebound or guarding. Normal bowel sounds. No appreciable masses or hepatomegaly. Rectal:  Not performed.  Psychiatric: Oriented to person, place and time. Demonstrates good judgement and reason without abnormal affect or behaviors.  No recent labs or imaging.  Assessment: 1.  Chronic constipation: Trulance works for her but it works as a bowel purge and leaves her in the house for 4 hours at a time, failed  Linzess, MiraLAX and Amitiza at high doses; thought related to her fixed colon and diverticular disease/stricturing  Plan: 1.  We will try Ibsrela today 50 mg p.o. twice daily before breakfast and dinner.  Gave her 15 days of samples and can also send in a prescription if this is helpful.  She will call and let us know. 2.  If the above is not helpful then would consider Movantik or Motegrity. 3.  Discussed with patient that if she gets to 3 to 4 days past a bowel movement on this medication then she can take the Trulance to empty out. 4.  Hopefully we will hit up on something that works well for her and does not leave her in the house for 4 hours at a time. 5.  Patient to follow in clinic with me in 2 months or sooner if needed.  She will let me know how things are going through [place].  Ellouise Newer, PA-C [facility] 03/20/2023, 11:23 AM  Cc: Moshe Cipro, MD

## 2023-03-20 NOTE — Patient Instructions (Signed)
You have been scheduled for follow up with Ellouise Newer, PA-C 05/29/23 at 11:00 am.  We have given you samples of IBSrela- Take 50 mg (1 capsule) twice daily. If these samples work well for you, please let us know.  _______________________________________________________  If your blood pressure at your visit was 140/90 or greater, please contact your primary care physician to follow up on this.  _______________________________________________________  If you are age 13 or older, your body mass index should be between 23-30. Your Body mass index is 26.24 kg/m. If this is out of the aforementioned range listed, please consider follow up with your Primary Care Provider.  If you are age 30 or younger, your body mass index should be between 19-25. Your Body mass index is 26.24 kg/m. If this is out of the aformentioned range listed, please consider follow up with your Primary Care Provider.   ________________________________________________________  The [facility] providers would like to encourage you to use [facility] to communicate with providers for non-urgent requests or questions.  Due to long hold times on the telephone, sending your provider a message by [facility] may be a faster and more efficient way to get a response.  Please allow 48 business hours for a response.  Please remember that this is for non-urgent requests.  _______________________________________________________  Due to recent changes in healthcare laws, you may see the results of your imaging and laboratory studies on MyChart before your provider has had a chance to review them.  We understand that in some cases there may be results that are confusing or concerning to you. Not all laboratory results come back in the same time frame and the provider may be waiting for multiple results in order to interpret others.  Please give [place] 48 hours in order for your provider to thoroughly review all the results before contacting the office  for clarification of your results.

## 2023-04-07 ENCOUNTER — Other Ambulatory Visit: Payer: Self-pay | Admitting: Physician Assistant

## 2023-04-07 DIAGNOSIS — K5909 Other constipation: Secondary | ICD-10-CM

## 2023-05-18 ENCOUNTER — Telehealth: Payer: Self-pay

## 2023-05-18 NOTE — Telephone Encounter (Signed)
Patient needing to speak with a nurse for a possible visit of cystitis.    Call back:  [phone]   Thank you.

## 2023-05-18 NOTE — Telephone Encounter (Signed)
Return call to patient for possible cystitis sxs. Patient states that she is burning at the end of urine stream. Patient was made of aware to do a urine drop off. Patient voiced understanding.

## 2023-05-21 ENCOUNTER — Telehealth: Payer: Self-pay

## 2023-05-21 ENCOUNTER — Ambulatory Visit: Payer: Medicare Other

## 2023-05-21 DIAGNOSIS — N3021 Other chronic cystitis with hematuria: Secondary | ICD-10-CM

## 2023-05-21 DIAGNOSIS — R300 Dysuria: Secondary | ICD-10-CM

## 2023-05-21 DIAGNOSIS — N3000 Acute cystitis without hematuria: Secondary | ICD-10-CM

## 2023-05-21 LAB — URINALYSIS, ROUTINE W REFLEX MICROSCOPIC
Bilirubin, UA: NEGATIVE
Glucose, UA: NEGATIVE
Ketones, UA: NEGATIVE
Nitrite, UA: POSITIVE — AB
Protein,UA: NEGATIVE
Specific Gravity, UA: 1.01 (ref 1.005–1.030)
Urobilinogen, Ur: 0.2 mg/dL (ref 0.2–1.0)
pH, UA: 6.5 (ref 5.0–7.5)

## 2023-05-21 LAB — MICROSCOPIC EXAMINATION: WBC, UA: >30 /hpf — AB (ref 0–5)

## 2023-05-21 MED ORDER — CIPROFLOXACIN HCL 500 MG PO TABS
500.0000 mg | ORAL_TABLET | Freq: Two times a day (BID) | ORAL | 0 refills | Status: DC
Start: 2023-05-21 — End: 2023-05-26

## 2023-05-21 NOTE — Progress Notes (Signed)
Patient presents today with complaints of  Cystitis sxs burning at the end of urine stream.  UA and Culture done today.  Dr. Annabell Howells reviewed results and started treatment 05/21/23 Cipro 500 mg BID X 6 days .  Patient aware of MD recommendations and that we will reach out with culture results.      [id number], CMA

## 2023-05-21 NOTE — Telephone Encounter (Signed)
Patient is aware of her urinalysis results and urine culture pending. Patient is made aware Rx Cipro 500 mg bid x 6 was sent to her pharmacy. Patient voiced understanding

## 2023-05-21 NOTE — Telephone Encounter (Signed)
Tried calling patient with no answer, left voiced message for return call. 

## 2023-05-25 LAB — URINE CULTURE

## 2023-05-26 ENCOUNTER — Telehealth: Payer: Self-pay

## 2023-05-26 ENCOUNTER — Other Ambulatory Visit: Payer: Self-pay

## 2023-05-26 MED ORDER — NITROFURANTOIN MONOHYD MACRO 100 MG PO CAPS: 100.0000 mg | ORAL_CAPSULE | Freq: Two times a day (BID) | ORAL | 0 refills | Status: DC

## 2023-05-26 MED ORDER — LUBIPROSTONE 24 MCG PO CAPS: 24.0000 ug | ORAL_CAPSULE | Freq: Two times a day (BID) | ORAL | 1 refills | Status: DC

## 2023-05-26 NOTE — Telephone Encounter (Signed)
Patient returned call and made aware of positive urine culture.  Patient informed to stop Cipro and start Macrobid. Patient voiced understanding.

## 2023-05-26 NOTE — Telephone Encounter (Signed)
Patient called with no answer. Message left to return call. Patient also made aware that a my-chart message would be sent.

## 2023-05-29 ENCOUNTER — Ambulatory Visit: Payer: Medicare Other | Admitting: Physician Assistant

## 2023-11-04 ENCOUNTER — Telehealth: Payer: Self-pay | Admitting: Physician Assistant

## 2023-11-04 NOTE — Telephone Encounter (Signed)
Inbound call from patient, states she would like [facility]

## 2023-11-05 MED ORDER — LUBIPROSTONE 24 MCG PO CAPS: 24.0000 ug | ORAL_CAPSULE | Freq: Two times a day (BID) | ORAL | 1 refills | Status: DC

## 2023-11-05 NOTE — Telephone Encounter (Signed)
 Sent sent to pharmacy

## 2024-03-09 ENCOUNTER — Encounter: Payer: Self-pay | Admitting: Urology

## 2024-03-09 ENCOUNTER — Ambulatory Visit: Payer: Medicare Other | Admitting: Urology

## 2024-03-09 VITALS — BP 124/68 | HR 84

## 2024-03-09 DIAGNOSIS — N3021 Other chronic cystitis with hematuria: Secondary | ICD-10-CM

## 2024-03-09 LAB — URINALYSIS, ROUTINE W REFLEX MICROSCOPIC
Bilirubin, UA: NEGATIVE
Glucose, UA: NEGATIVE
Ketones, UA: NEGATIVE
Nitrite, UA: NEGATIVE
Protein,UA: NEGATIVE
RBC, UA: NEGATIVE
Specific Gravity, UA: 1.015 (ref 1.005–1.030)
Urobilinogen, Ur: 0.2 mg/dL (ref 0.2–1.0)
pH, UA: 6.5 (ref 5.0–7.5)

## 2024-03-09 LAB — MICROSCOPIC EXAMINATION

## 2024-03-09 MED ORDER — CIPROFLOXACIN HCL 250 MG PO TABS
250.0000 mg | ORAL_TABLET | Freq: Two times a day (BID) | ORAL | 11 refills | Status: AC
Start: 2024-03-09 — End: ?

## 2024-03-09 MED ORDER — NITROFURANTOIN MONOHYD MACRO 100 MG PO CAPS
100.0000 mg | ORAL_CAPSULE | Freq: Two times a day (BID) | ORAL | 3 refills | Status: AC
Start: 2024-03-09 — End: ?

## 2024-03-09 NOTE — Patient Instructions (Signed)

## 2024-03-09 NOTE — Progress Notes (Signed)
 03/09/2024 11:00 AM   Stacie Ferrell 01/23/1946 [id number]  Referring provider: Romeo Rabon, MD [address],  [place] [address]  Followup frequent UTI   HPI: Stacie Ferrell is a 78yo here for followup for frequent UTI. She has had 1 UTI since last visit. She was treated with macrobid in 04/2023. No other complaints today. She is on Amitiza for IBS. No other complaints today   PMH: Past Medical History:  Diagnosis Date   Anxiety    Asthma    Chronic obstructive pulmonary disease (COPD) ([facility])    GERD (gastroesophageal reflux disease)    Hypercholesterolemia    Insomnia    Osteoarthrosis    Osteoporosis    Postmenopausal atrophic vaginitis    Tobacco use disorder     Surgical History: Past Surgical History:  Procedure Laterality Date   APPENDECTOMY  2007   BREAST BIOPSY     cecum volvulus abdominal surgery     COLONOSCOPY     ESOPHAGOGASTRODUODENOSCOPY     ingrown toenails     VAGINAL HYSTERECTOMY      Home Medications:  Allergies as of 03/09/2024       Reactions   Amoxicillin-pot Clavulanate    Other Reaction(s): Not available   Codeine Sulfate [codeine] Nausea Only   On an empty stomach   Fosamax [alendronate] Other (See Comments)   GERD worse and bone ache   Lipitor [atorvastatin] Other (See Comments)   Muscle aches   Daypro [oxaprozin] Rash   Voltaren [diclofenac] Rash        Medication List        Accurate as of March 09, 2024 11:00 AM. If you have any questions, ask your nurse or doctor.          Baclofen 5 MG Tabs Take 1 tablet by mouth as needed.   calcium carbonate 1500 (600 Ca) MG Tabs tablet Commonly known as: OSCAL Take by mouth 2 (two) times daily with a meal.   cholecalciferol 25 MCG (1000 UNIT) tablet Commonly known as: VITAMIN D3 Take 1,000 Units by mouth daily.   ciprofloxacin 250 MG tablet Commonly known as: Cipro Take 1 tablet (250 mg total) by mouth 2 (two) times daily. As needed   diphenhydrAMINE 25 MG  tablet Commonly known as: BENADRYL Take 25 mg by mouth every 6 (six) hours as needed.   lubiprostone 24 MCG capsule Commonly known as: AMITIZA Take 1 capsule (24 mcg total) by mouth 2 (two) times daily with a meal.   nitrofurantoin (macrocrystal-monohydrate) 100 MG capsule Commonly known as: MACROBID Take 1 capsule (100 mg total) by mouth 2 (two) times daily.   omeprazole 40 MG capsule Commonly known as: PRILOSEC Take 40 mg by mouth as needed.   Prolia 60 MG/ML Sosy injection Generic drug: denosumab Inject 60 mg into the skin every 6 (six) months.   rosuvastatin 10 MG tablet Commonly known as: CRESTOR Take 10 mg by mouth at bedtime.   SENIOR MULTIVITAMIN PLUS PO Take by mouth.   ICAPS AREDS 2 PO Take by mouth.   traMADol 50 MG tablet Commonly known as: ULTRAM Take 25 mg by mouth as needed.        Allergies:  Allergies  Allergen Reactions   Amoxicillin-Pot Clavulanate     Other Reaction(s): Not available   Codeine Sulfate [Codeine] Nausea Only    On an empty stomach   Fosamax [Alendronate] Other (See Comments)    GERD worse and bone ache   Lipitor [Atorvastatin]  Other (See Comments)    Muscle aches   Daypro [Oxaprozin] Rash   Voltaren [Diclofenac] Rash    Family History: Family History  Problem Relation Age of Onset   Congestive Heart Failure Mother    COPD Mother    Cancer Mother    Colon cancer Father    Parkinson's disease Father     Social History:  reports that she has been smoking cigarettes. She has never used smokeless tobacco. She reports that she does not drink alcohol and does not use drugs.  ROS: All other review of systems were reviewed and are negative except what is noted above in HPI  Physical Exam: BP 124/68   Pulse 84   Constitutional:  Alert and oriented, No acute distress. HEENT: [place] AT, moist mucus membranes.  Trachea midline, no masses. Cardiovascular: No clubbing, cyanosis, or edema. Respiratory: Normal respiratory effort,  no increased work of breathing. GI: Abdomen is soft, nontender, nondistended, no abdominal masses GU: No CVA tenderness.  Lymph: No cervical or inguinal lymphadenopathy. Skin: No rashes, bruises or suspicious lesions. Neurologic: Grossly intact, no focal deficits, moving all 4 extremities. Psychiatric: Normal mood and affect.  Laboratory Data: No results found for: "WBC", "HGB", "HCT", "MCV", "PLT"  No results found for: "CREATININE"  No results found for: "PSA"  No results found for: "TESTOSTERONE"  No results found for: "HGBA1C"  Urinalysis    Component Value Date/Time   APPEARANCEUR Clear 05/21/2023 1123   GLUCOSEU Negative 05/21/2023 1123   BILIRUBINUR Negative 05/21/2023 1123   PROTEINUR Negative 05/21/2023 1123   UROBILINOGEN 0.2 07/12/2020 1111   NITRITE Positive (A) 05/21/2023 1123   LEUKOCYTESUR 3+ (A) 05/21/2023 1123    Lab Results  Component Value Date   LABMICR See below: 05/21/2023   WBCUA >30 (A) 05/21/2023   LABEPIT 0-10 05/21/2023   BACTERIA Many (A) 05/21/2023    Pertinent Imaging:  No results found for this or any previous visit.  No results found for this or any previous visit.  No results found for this or any previous visit.  No results found for this or any previous visit.  No results found for this or any previous visit.  No results found for this or any previous visit.  No results found for this or any previous visit.  No results found for this or any previous visit.   Assessment & Plan:    1. Chronic cystitis with hematuria (Primary) continue prophylactic cipro 50mg  prn. She was alos given RX for self start macrobid - Urinalysis, Routine w reflex microscopic   No follow-ups on file.  Stacie Aye, MD  [facility]

## 2024-03-10 ENCOUNTER — Telehealth: Payer: Self-pay | Admitting: Urology

## 2024-03-10 NOTE — Telephone Encounter (Signed)
 Patient called and made aware of flagged urinalysis results.  Patient denied any signs or symptoms of a uti. Patients states she will call office if she develops any symptoms.

## 2024-03-10 NOTE — Telephone Encounter (Signed)
 Patient got a message of abnormal urine and wants a call to advise

## 2024-03-15 ENCOUNTER — Encounter: Payer: Self-pay | Admitting: Urology

## 2024-05-26 ENCOUNTER — Other Ambulatory Visit: Payer: Self-pay | Admitting: Physician Assistant

## 2024-05-31 ENCOUNTER — Telehealth: Payer: Self-pay | Admitting: Physician Assistant

## 2024-05-31 NOTE — Telephone Encounter (Signed)
 Patient called and stated that she is needing a refill on her medication Amitza  24 MCG. Patient was scheduled for July 14 th at 10:40 AM. Please advise.

## 2024-06-02 MED ORDER — LUBIPROSTONE 24 MCG PO CAPS: 24.0000 ug | ORAL_CAPSULE | Freq: Two times a day (BID) | ORAL | 0 refills | Status: DC

## 2024-06-02 NOTE — Telephone Encounter (Signed)
 Script sent to pharmacy.

## 2024-06-03 MED ORDER — LUBIPROSTONE 24 MCG PO CAPS: 24.0000 ug | ORAL_CAPSULE | Freq: Two times a day (BID) | ORAL | 0 refills | Status: DC

## 2024-06-03 NOTE — Addendum Note (Signed)
 Addended by: Cain Castillo on: 06/03/2024 04:59 PM   Modules accepted: Orders

## 2024-06-03 NOTE — Telephone Encounter (Signed)
 Corrected script to sent [facility].

## 2024-06-03 NOTE — Telephone Encounter (Signed)
 PT returning call to let us  know that the lubiprostone  was sent to the wrong pharmacy. [facility]

## 2024-07-11 ENCOUNTER — Ambulatory Visit: Admitting: Physician Assistant

## 2024-08-02 ENCOUNTER — Encounter: Payer: Self-pay | Admitting: Physician Assistant

## 2024-08-02 ENCOUNTER — Ambulatory Visit (INDEPENDENT_AMBULATORY_CARE_PROVIDER_SITE_OTHER): Admitting: Physician Assistant

## 2024-08-02 VITALS — BP 128/66 | HR 77 | Ht 63.5 in | Wt 151.6 lb

## 2024-08-02 DIAGNOSIS — K5909 Other constipation: Secondary | ICD-10-CM

## 2024-08-02 MED ORDER — LUBIPROSTONE 24 MCG PO CAPS: 24.0000 ug | ORAL_CAPSULE | Freq: Two times a day (BID) | ORAL | 3 refills | Status: DC

## 2024-08-02 MED ORDER — LUBIPROSTONE 24 MCG PO CAPS: 24.0000 ug | ORAL_CAPSULE | Freq: Two times a day (BID) | ORAL | 3 refills | Status: AC

## 2024-08-02 NOTE — Progress Notes (Signed)
 Chief Complaint: Follow-up chronic constipation  HPI:    [name] is a 78 year old female with a past medical history as listed below, known to Dr. Avram, who returns to clinic today for follow-up of her chronic patient.    02/17/2019 colonoscopy by Dr. Tobie in [place] with 2 small subcentimeter adenomas in the cecum and adherent torturous rectosigmoid colon with some diverticulosis.  Repeat would be recommended in 7 to 10 years.    04/04/2020 patient seen in clinic by Dr. Avram for constipation. At that time had a 4-day period of fairly severe constipation about 8 weeks prior. She had been told to use MiraLAX to twice daily which she had titrated down to once daily. Also had some fecal leakage occasionally. 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 At times recommend she stop MiraLAX and see how she does. Discussed that she may need to titrate MiraLAX back up but she was not having troubles at that time. It was discussed that she had diverticulosis and there can be some narrowing of the colon associated with that and the fact that her prior hysterectomy probably led to a fixed sigmoid colon.     07/17/2021 patient seen and described that over the past few months she has started back with some constipation.  At that time ordered abdominal x-ray two-view and gave her a Plenvu bowel prep.  Prescribed Trulance  3 mg p.o. daily.  At that time discussed that if medication does not fix the problem then we may need to repeat a colonoscopy.    07/17/2021 abdominal x-ray showed air and stool-filled nondilated loops of bowel, moderate colonic stool burden predominantly in the right and transverse colon.  At that time had already recommended she complete a bowel prep and start Trulance  daily.     09/20/2021 patient was doing well taking her Trulance  in the morning and 1-2 stool softeners at night.  Continued on Trulance .    02/12/2023 patient seen in clinic and at that time discussed that the Trulance  did work but it was more of a bowel purge/cleanse and she could not leave the house for 4 hours after taking it.  That time is because she had tried Linzess in the past which was too expensive.  Trulance  is also expensive.  At that time trialed Amitiza  8 mcg twice daily with food.    02/26/2023 patient called and is requesting a higher dose of Amitiza , 24 mcg twice daily was sent to [facility] for her.    03/20/2023 patient presented to clinic and told me she tried Amitiza  24 mcg twice a day for a month and a half but it did not help her at all.  She ended up taking Trulance  on the 3rd or 4th day after having a bowel movement every week because she was worried about becoming obstructed.  At that time tried Ibsrela 50 mg twice daily before breakfast and dinner.  Also discussed considering Movantik or Motegrity if this did not help for her.    06/05/2024 patient called in wanting a refill of Amitiza  24 mcg.    Today, patient presents to clinic accompanied by her significant other.  She does not recall trying the Ibsrela and instead went back on Amitiza  24 mcg twice a day, this  will typically result in a good bowel movement at least once every 3 days, but in between she gets a lot of small pebbles stool.  She also takes 2 stool softeners in the evenings.  She would like to be going more frequently due to her fear of bowel obstruction which we have discussed on multiple visits.  She does continue to use Trulance  as a bowel purge if needed.  Denies any abdominal pain, nausea or vomiting.    Denies fever, chills or symptoms that awaken her from sleep.     Past Medical History:  Diagnosis Date   Anxiety    Asthma    Chronic obstructive pulmonary disease (COPD) (HCC)    GERD (gastroesophageal reflux disease)     Hypercholesterolemia    Insomnia    Osteoarthrosis    Osteoporosis    Postmenopausal atrophic vaginitis    Tobacco use disorder     Past Surgical History:  Procedure Laterality Date   APPENDECTOMY  2007   BREAST BIOPSY     cecum volvulus abdominal surgery     COLONOSCOPY     ESOPHAGOGASTRODUODENOSCOPY     ingrown toenails     VAGINAL HYSTERECTOMY      Current Outpatient Medications  Medication Sig Dispense Refill   Baclofen 5 MG TABS Take 1 tablet by mouth as needed.     calcium carbonate (OSCAL) 1500 (600 Ca) MG TABS tablet Take by mouth 2 (two) times daily with a meal.     cholecalciferol (VITAMIN D3) 25 MCG (1000 UNIT) tablet Take 1,000 Units by mouth daily.     ciprofloxacin  (CIPRO ) 250 MG tablet Take 1 tablet (250 mg total) by mouth 2 (two) times daily. As needed 10 tablet 11   denosumab (PROLIA) 60 MG/ML SOSY injection Inject 60 mg into the skin every 6 (six) months.     diphenhydrAMINE (BENADRYL) 25 MG tablet Take 25 mg by mouth every 6 (six) hours as needed.     lubiprostone  (AMITIZA ) 24 MCG capsule Take 1 capsule (24 mcg total) by mouth 2 (two) times daily with a meal. 180 capsule 0   Multiple Vitamins-Minerals (ICAPS AREDS 2 PO) Take by mouth.     Multiple Vitamins-Minerals (SENIOR MULTIVITAMIN PLUS PO) Take by mouth.     nitrofurantoin , macrocrystal-monohydrate, (MACROBID ) 100 MG capsule Take 1 capsule (100 mg total) by mouth 2 (two) times daily. 14 capsule 3   omeprazole (PRILOSEC) 40 MG capsule Take 40 mg by mouth as needed.     rosuvastatin (CRESTOR) 10 MG tablet Take 10 mg by mouth at bedtime.     traMADol (ULTRAM) 50 MG tablet Take 25 mg by mouth as needed.     No current facility-administered medications for this visit.    Allergies as of 08/02/2024 - Review Complete 08/02/2024  Allergen Reaction Noted   Amoxicillin-pot clavulanate  03/20/2023   Codeine sulfate [codeine] Nausea Only 03/27/2020   Fosamax [alendronate] Other (See Comments) 03/27/2020    Lipitor [atorvastatin] Other (See Comments) 03/20/2023   Daypro [oxaprozin] Rash 03/14/2020   Voltaren [diclofenac] Rash 03/27/2020    Family History  Problem Relation Age of Onset   Congestive Heart Failure Mother    COPD Mother    Cancer Mother    Colon cancer Father    Parkinson's disease Father     Social History   Socioeconomic History   Marital status: Widowed    Spouse name: Not on file   Number of children: 2   Years of education: Not  on file   Highest education level: Not on file  Occupational History   Occupation: retired  Tobacco Use   Smoking status: Every Day    Current packs/day: 0.50    Types: Cigarettes   Smokeless tobacco: Never  Vaping Use   Vaping status: Never Used  Substance and Sexual Activity   Alcohol use: Never   Drug use: Never   Sexual activity: Yes  Other Topics Concern   Not on file  Social History Narrative   Widowed and retired 2 children   She is a smoker but no alcohol or drug use   Social Drivers of Corporate investment banker Strain: Not on file  Food Insecurity: Not on file  Transportation Needs: Not on file  Physical Activity: Not on file  Stress: Not on file  Social Connections: Not on file  Intimate Partner Violence: Not on file    Review of Systems:    Constitutional: No weight loss, fever or chills Cardiovascular: No chest pain Respiratory: No SOB or cough Gastrointestinal: See HPI and otherwise negative   Physical Exam:  Vital signs: BP 128/66 (BP Location: Right Arm, Patient Position: Sitting, Cuff Size: Normal)   Pulse 77   Ht 5' 3.5 (1.613 m)   Wt 151 lb 9 oz (68.7 kg) Comment: Patient stated (weighed heself at home)  BMI 26.43 kg/m    Constitutional:   Pleasant elderly Caucasian female appears to be in NAD, Well developed, Well nourished, alert and cooperative Head:  Normocephalic and atraumatic. Eyes:   PEERL, EOMI. No icterus. Conjunctiva pink. Ears:  Normal auditory acuity. Neck:  Supple Throat:  Oral cavity and pharynx without inflammation, swelling or lesion.  Respiratory: Respirations even and unlabored. Lungs clear to auscultation bilaterally.   No wheezes, crackles, or rhonchi.  Cardiovascular: Normal S1, S2. No MRG. Regular rate and rhythm. No peripheral edema, cyanosis or pallor.  Gastrointestinal:  Soft, nondistended, nontender. No rebound or guarding. Normal bowel sounds. No appreciable masses or hepatomegaly. Rectal:  Not performed.  Msk:  Symmetrical without gross deformities. Without edema, no deformity or joint abnormality.  Neurologic:  Alert and  oriented x4;  grossly normal neurologically.  Skin:   Dry and intact without significant lesions or rashes. Psychiatric: Demonstrates good judgement and reason without abnormal affect or behaviors.  No recent labs or imaging.  Assessment: 1.  Chronic constipation: Patient has tried multiple products in the past and has failed Amitiza , Linzess and MiraLAX at high doses, Trulance  works for her but as a bowel purge, last visit tried on Ibsrela 50 mg twice daily, but this did not work well for her, she went back to Amitiza  24 mcg twice a day with 2 of stool softeners in the evening and has a good bowel movement about once every 3 days with pebbles in between; thought related to her fixed colon and diverticular disease/stricturing  Plan: 1.  Again discussed that she is at low risk for bowel obstruction.  Tried to reassure her. 2.  Discussed that she should readjust her normal.  At this point I do not think were getting back to where she was having a daily bowel movement every day in her 17s.  If she has 1 good bowel movement every 3 days and is still moving gas and not having extreme amount of abdominal pain or other symptoms in between then we should consider that a win. 3.  For now continue Amitiza  24 mcg twice a day #180 with 3 refills.  Sent  prescriptions to her mail order pharmacy per her request. 4.  Continue Stool softeners 2 in  the evenings and add MiraLAX daily.  Discussed increasing MiraLAX as needed. 5.  Patient can continue to use Trulance  as a rescue medication, this does give her a bowel purge effect. 6.  Patient to follow in clinic in a year or sooner if necessary.     Delon Failing, PA-C [facility] 08/02/2024, 10:14 AM  Cc: Stacie Sharper, MD

## 2024-08-02 NOTE — Patient Instructions (Addendum)
 We have sent the following medications to your pharmacy for you to pick up at your convenience: Amitiza  24 mcg twice daily  Add Miralax 1 capful daily in 8 ounces of liquid.  _______________________________________________________  If your blood pressure at your visit was 140/90 or greater, please contact your primary care physician to follow up on this.  _______________________________________________________  If you are age 78 or older, your body mass index should be between 23-30. Your Body mass index is 26.43 kg/m. If this is out of the aforementioned range listed, please consider follow up with your Primary Care Provider.  If you are age 40 or younger, your body mass index should be between 19-25. Your Body mass index is 26.43 kg/m. If this is out of the aformentioned range listed, please consider follow up with your Primary Care Provider.   ________________________________________________________  The [facility] providers would like to encourage you to use MYCHART to communicate with providers for non-urgent requests or questions.  Due to long hold times on the telephone, sending your provider a message by [facility] may be a faster and more efficient way to get a response.  Please allow 48 business hours for a response.  Please remember that this is for non-urgent requests.  _______________________________________________________  [facility] is using a team-based approach to care.  Your team is made up of your doctor and two to three APPS. Our APPS (Nurse Practitioners and Physician Assistants) work with your physician to ensure care continuity for you. They are fully qualified to address your health concerns and develop a treatment plan. They communicate directly with your gastroenterologist to care for you. Seeing the Advanced Practice Practitioners on your physician's team can help you by facilitating care more promptly, often allowing for earlier appointments, access to  diagnostic testing, procedures, and other specialty referrals.

## 2025-01-25 ENCOUNTER — Ambulatory Visit: Admitting: Internal Medicine

## 2025-02-20 ENCOUNTER — Ambulatory Visit: Admitting: Internal Medicine

## 2025-03-08 ENCOUNTER — Ambulatory Visit: Admitting: Urology
